# Patient Record
Sex: Female | Born: 1985 | Race: White | Hispanic: No | Marital: Married | Smoking: Never smoker
Health system: Southern US, Community
[De-identification: ages and names within clinical notes are randomized; demographics above are authoritative.]

## PROBLEM LIST (undated history)

## (undated) DIAGNOSIS — E119 Type 2 diabetes mellitus without complications: Secondary | ICD-10-CM

## (undated) DIAGNOSIS — R000 Tachycardia, unspecified: Secondary | ICD-10-CM

## (undated) HISTORY — DX: Type 2 diabetes mellitus without complications: E11.9

## (undated) HISTORY — PX: WISDOM TOOTH EXTRACTION: SHX21

---

## 2008-05-26 ENCOUNTER — Encounter: Payer: Self-pay | Admitting: Internal Medicine

## 2009-03-06 ENCOUNTER — Ambulatory Visit: Payer: Self-pay | Admitting: Internal Medicine

## 2009-03-06 DIAGNOSIS — Z9189 Other specified personal risk factors, not elsewhere classified: Secondary | ICD-10-CM | POA: Insufficient documentation

## 2009-03-06 DIAGNOSIS — E109 Type 1 diabetes mellitus without complications: Secondary | ICD-10-CM | POA: Insufficient documentation

## 2009-03-06 LAB — CONVERTED CEMR LAB: Pap Smear: NORMAL

## 2009-03-10 LAB — CONVERTED CEMR LAB
BUN: 10 mg/dL (ref 6–23)
CO2: 21 meq/L (ref 19–32)
Calcium: 9.5 mg/dL (ref 8.4–10.5)
Chloride: 110 meq/L (ref 96–112)
Creatinine, Ser: 0.8 mg/dL (ref 0.4–1.5)
Creatinine,U: 33.1 mg/dL
GFR calc non Af Amer: 125.38 mL/min (ref >60)
Glucose, Bld: 260 mg/dL — ABNORMAL HIGH (ref 70–99)
Hgb A1c MFr Bld: 7.2 % — ABNORMAL HIGH (ref 4.6–6.5)
Microalb Creat Ratio: 6 mg/g (ref 0.0–30.0)
Microalb, Ur: 0.2 mg/dL (ref 0.0–1.9)
Potassium: 4.8 meq/L (ref 3.5–5.1)
Sodium: 146 meq/L — ABNORMAL HIGH (ref 135–145)

## 2009-03-11 ENCOUNTER — Ambulatory Visit: Payer: Self-pay | Admitting: Endocrinology

## 2009-03-11 DIAGNOSIS — R000 Tachycardia, unspecified: Secondary | ICD-10-CM

## 2009-03-12 LAB — CONVERTED CEMR LAB
Basophils Absolute: 0 10*3/uL (ref 0.0–0.1)
Basophils Relative: 0.6 % (ref 0.0–3.0)
Cholesterol: 154 mg/dL (ref 0–200)
Eosinophils Absolute: 0.1 10*3/uL (ref 0.0–0.7)
Eosinophils Relative: 1.9 % (ref 0.0–5.0)
HCT: 44.6 % (ref 36.0–46.0)
HDL: 51 mg/dL (ref >39.00)
Hemoglobin: 15 g/dL (ref 12.0–15.0)
LDL Cholesterol: 94 mg/dL (ref 0–99)
Lymphocytes Relative: 24.8 % (ref 12.0–46.0)
Lymphs Abs: 1.4 10*3/uL (ref 0.7–4.0)
MCHC: 33.7 g/dL (ref 30.0–36.0)
MCV: 88.8 fL (ref 78.0–100.0)
Monocytes Absolute: 0.4 10*3/uL (ref 0.1–1.0)
Monocytes Relative: 7.4 % (ref 3.0–12.0)
Neutro Abs: 3.6 10*3/uL (ref 1.4–7.7)
Neutrophils Relative %: 65.3 % (ref 43.0–77.0)
Platelets: 224 10*3/uL (ref 150.0–400.0)
RBC: 5.02 M/uL (ref 3.87–5.11)
RDW: 11.8 % (ref 11.5–14.6)
TSH: 1.47 microintl units/mL (ref 0.35–5.50)
Total CHOL/HDL Ratio: 3
Triglycerides: 45 mg/dL (ref 0.0–149.0)
VLDL: 9 mg/dL (ref 0.0–40.0)
WBC: 5.5 10*3/uL (ref 4.5–10.5)

## 2009-04-09 ENCOUNTER — Telehealth: Payer: Self-pay | Admitting: Endocrinology

## 2009-05-07 ENCOUNTER — Telehealth: Payer: Self-pay | Admitting: Internal Medicine

## 2009-07-23 ENCOUNTER — Telehealth (INDEPENDENT_AMBULATORY_CARE_PROVIDER_SITE_OTHER): Payer: Self-pay | Admitting: *Deleted

## 2009-09-30 ENCOUNTER — Telehealth: Payer: Self-pay | Admitting: Endocrinology

## 2009-12-28 ENCOUNTER — Encounter: Admission: RE | Admit: 2009-12-28 | Discharge: 2009-12-28 | Payer: Self-pay | Admitting: Obstetrics and Gynecology

## 2010-01-27 ENCOUNTER — Telehealth: Payer: Self-pay | Admitting: Endocrinology

## 2010-02-05 ENCOUNTER — Ambulatory Visit: Payer: Self-pay | Admitting: Endocrinology

## 2010-03-09 ENCOUNTER — Telehealth: Payer: Self-pay | Admitting: Internal Medicine

## 2010-04-28 ENCOUNTER — Encounter: Payer: Self-pay | Admitting: Internal Medicine

## 2010-05-12 ENCOUNTER — Telehealth: Payer: Self-pay | Admitting: Endocrinology

## 2010-06-19 ENCOUNTER — Other Ambulatory Visit (HOSPITAL_COMMUNITY): Payer: Self-pay | Admitting: Obstetrics and Gynecology

## 2010-06-19 DIAGNOSIS — N6320 Unspecified lump in the left breast, unspecified quadrant: Secondary | ICD-10-CM

## 2010-06-19 DIAGNOSIS — N6310 Unspecified lump in the right breast, unspecified quadrant: Secondary | ICD-10-CM

## 2010-06-29 NOTE — Progress Notes (Signed)
Summary: OV due  Phone Note Outgoing Call Call back at [facility] Phone [phone]   Call placed by: Brenton Grills MA,  January 27, 2010 4:44 PM Details for Reason: OV due Summary of Call: Per MD, pt is due for OV. Left message on VM to callback office  Follow-up for Phone Call        PT CALLED.  GAVE HER AN APPT ON SEPT 9TH. Follow-up by: Hilarie Fredrickson,  January 28, 2010 10:12 AM

## 2010-06-29 NOTE — Assessment & Plan Note (Signed)
Summary: FU PER PHONE NOTE/NWS  #   Vital Signs:  Patient profile:   25 year old female Menstrual status:  regular Height:      67 inches (170.18 cm) Weight:      150.75 pounds (68.52 kg) BMI:     23.70 O2 Sat:      98 % on Room air Temp:     98.2 degrees F (36.78 degrees C) oral Pulse rate:   127 / minute BP sitting:   120 / 74  (left arm) Cuff size:   regular  Vitals Entered By: Brenton Grills MA (February 05, 2010 1:09 PM)  O2 Flow:  Room air CC: Follow-up visit/Lantus is listed twice, pt is using Lantus Solostar/aj Is Patient Diabetic? Yes   Primary Pamela Martin:  Newt Lukes MD  CC:  Follow-up visit/Lantus is listed twice and pt is using Lantus Solostar/aj.  History of Present Illness: the status of at least 3 ongoing medical problems is addressed today: dm:  no cbg record, but states cbg's are extremely variable, with no pattern throughout the day.  she says she takes oral contraceptives, and is not considering a pregnancy anytime soon.   anxiety:  she attributes probs managing dm to work-related anxiety. tachycardia:  is again noted today.  she says this is related to going to dr's office, and has been recurrent over many years.    Current Medications (verified): 1)  Lantus 100 Unit/ml Soln (Insulin Glargine) .... Use 24 Units At Bedtime 2)  Loestrin 24 Fe 1-20 Mg-Mcg Tabs (Norethin Ace-Eth Estrad-Fe) .... Take 1 By Mouth Qd 3)  Lantus Solostar 100 Unit/ml Soln (Insulin Glargine) .... 24 Units Qd 4)  Novolog Flexpen 100 Unit/ml Soln (Insulin Aspart) .... As Directed, Total of 45 Units Per Day 5)  Bd Pen Needle Short U/f 31g X 8 Mm Misc (Insulin Pen Needle) .... Any Brand Is Ok. Use Qid 6)  Onetouch Ultra Test  Strp (Glucose Blood) .... 5x A Day 150.01  Allergies (verified): No Known Drug Allergies  Past History:  Past Medical History: Last updated: 03/06/2009 Diabetes mellitus, type I  Review of Systems       she has intermittent hypoglycemia  Physical  Exam  General:  normal appearance.   Neck:  Supple without thyroid enlargement or tenderness. .  Lungs:  Clear to auscultation bilaterally. Normal respiratory effort.  Heart:  tachycardic rate and reg rhythm, without murmurs or gallops noted. Normal S1,S2.   Pulses:  dorsalis pedis intact bilat.  Extremities:  no deformity.  no ulcer on the feet.  feet are of normal color and temp.  no edema  Neurologic:  sensation is intact to touch on the feet  Skin:  normal texture and temp.  no rash.  not diaphoretic  Cervical Nodes:  No significant adenopathy.  Psych:  Alert and cooperative; normal mood and affect; normal attention span and concentration.     Impression & Recommendations:  Problem # 1:  DIABETES MELLITUS, TYPE I (ICD-250.01) pt says she wants to do labs at next ov  Problem # 2:  anxiety pt declines med for this  Problem # 3:  TACHYCARDIA (ICD-785.0) Assessment: Unchanged uncertain etiology  Medications Added to Medication List This Visit: 1)  Novolog Flexpen 100 Unit/ml Soln (Insulin aspart) .... Three times a day (just before each meal) 05-07-11 units 2)  Onetouch Ultra Test Strp (Glucose blood) .... 5x a day 250.01, and lancets 5x a day  Other Orders: Est. Patient Level III (  528[address])  Patient Instructions: 1)  check your blood sugar 5 times a day--before the 3 meals, and at bedtime.  also check if you have symptoms of your blood sugar being too high or too low.  please keep a record of the readings and bring it to your next appointment here.  please call us sooner if you are having frequent low blood sugar episodes. 2)  Please schedule a follow-up appointment in 3 months. 3)  tests are being ordered for you today.  a few days after the test(s), please call [phone] to hear your test results. 4)  please see a psychologist.  the appointment person will write a phone number for you on this paper.   5)  same lantus 6)  take novolog three times a day (just before each meal)  05-07-11 units. 7)  cc dr Renaldo Fiddler. 8)  Please schedule a follow-up appointment in 1 month. Prescriptions: ONETOUCH ULTRA TEST  STRP (GLUCOSE BLOOD) 5x a day 250.01, and lancets 5x a day  #150 x 11   Entered and Authorized by:   Minus Breeding MD   Signed by:   Minus Breeding MD on 02/05/2010   Method used:   Electronically to        [facility]. [phone]* (retail)       [phone]       [place], [place]  [address]       Ph: [phone]       Fax: [phone]   RxID:   [id number]

## 2010-06-29 NOTE — Progress Notes (Signed)
Summary: Loestrin  Phone Note Refill Request Message from:  Fax from Pharmacy on Sep 30, 2009 11:58 AM  Refills Requested: Medication #1:  LOESTRIN 24 FE 1-20 MG-MCG TABS take 1 by mouth qd Initial call taken by: Orlan Leavens,  Sep 30, 2009 11:58 AM  Follow-up for Phone Call        MD only see md for diabetes. Need to contact patient GYN or PCP to get approval. Fax info back to pharm Follow-up by: Orlan Leavens,  Sep 30, 2009 11:59 AM

## 2010-06-29 NOTE — Progress Notes (Signed)
Summary: Youth worker Note Call from Patient   Caller: Patient Call For: ellison Summary of Call: pt request increase in amount of onetouch ultra strip---state she get 100 per month now but states she needs more --request over 150 per month---[facility]  on [address]   Please advise.  Initial call taken by: Tora Perches,  July 23, 2009 3:59 PM  Follow-up for Phone Call        sent Follow-up by: Minus Breeding MD,  July 23, 2009 4:13 PM  Additional Follow-up for Phone Call Additional follow up Details #1::        Informed pt. Additional Follow-up by: Josph Macho RMA,  July 23, 2009 4:23 PM    New/Updated Medications: ONETOUCH ULTRA TEST  STRP (GLUCOSE BLOOD) 5x a day 150.01 Prescriptions: ONETOUCH ULTRA TEST  STRP (GLUCOSE BLOOD) 5x a day 150.01  #150 x 11   Entered and Authorized by:   Minus Breeding MD   Signed by:   Minus Breeding MD on 07/23/2009   Method used:   Electronically to        [facility]. [phone]* (retail)       [phone]       [place], [place]  [address]       Ph: [phone]       Fax: [phone]   RxID:   [id number]

## 2010-06-29 NOTE — Progress Notes (Signed)
Summary: Referral  Phone Note Call from Patient Call back at Home Phone [phone]   Caller: Patient Summary of Call: Pt called requesting referral to Dr Talmage Nap at [facility]. Initial call taken by: Margaret Pyle, CMA,  March 09, 2010 2:28 PM  Follow-up for Phone Call        ok - order done as per request Follow-up by: Newt Lukes MD,  March 09, 2010 3:11 PM  Additional Follow-up for Phone Call Additional follow up Details #1::        Pt informed Additional Follow-up by: Margaret Pyle, CMA,  March 09, 2010 3:43 PM

## 2010-07-01 ENCOUNTER — Other Ambulatory Visit: Payer: Self-pay

## 2010-07-01 NOTE — Progress Notes (Signed)
Summary: rx refill req  Phone Note Refill Request Message from:  Fax from Pharmacy on May 12, 2010 11:07 AM  Refills Requested: Medication #1:  NOVOLOG FLEXPEN 100 UNIT/ML SOLN three times a day (just before each meal) 05-07-11 units   Dosage confirmed as above?Dosage Confirmed   Last Refilled: 03/29/2010  Method Requested: Electronic Next Appointment Scheduled: none Initial call taken by: Brenton Grills CMA Duncan Dull),  May 12, 2010 11:07 AM    Prescriptions: NOVOLOG FLEXPEN 100 UNIT/ML SOLN (INSULIN ASPART) three times a day (just before each meal) 05-07-11 units  #1 month x 3   Entered by:   Brenton Grills CMA (AAMA)   Authorized by:   Minus Breeding MD   Signed by:   Brenton Grills CMA (AAMA) on 05/12/2010   Method used:   Electronically to        [facility]. [phone]* (retail)       [phone] [address]       [place], [place]  [address]       Ph: [phone]       Fax: [phone]   RxID:   [id number]

## 2010-07-01 NOTE — Consult Note (Signed)
Summary: [facility]  [facility]   Imported By: Lester Norwalk 05/26/2010 11:20:28  _____________________________________________________________________  External Attachment:    Type:   Image     Comment:   External Document

## 2010-07-05 ENCOUNTER — Ambulatory Visit
Admission: RE | Admit: 2010-07-05 | Discharge: 2010-07-05 | Disposition: A | Discharge status: Home / Self Care | Payer: BC Managed Care – PPO | Source: Ambulatory Visit | Attending: Obstetrics and Gynecology | Admitting: Obstetrics and Gynecology

## 2010-07-05 DIAGNOSIS — N6310 Unspecified lump in the right breast, unspecified quadrant: Secondary | ICD-10-CM

## 2014-08-19 ENCOUNTER — Other Ambulatory Visit: Payer: BC Managed Care – PPO

## 2014-08-19 ENCOUNTER — Encounter: Payer: Self-pay | Admitting: Internal Medicine

## 2014-08-19 ENCOUNTER — Ambulatory Visit (INDEPENDENT_AMBULATORY_CARE_PROVIDER_SITE_OTHER): Payer: BC Managed Care – PPO | Admitting: Internal Medicine

## 2014-08-19 VITALS — BP 120/90 | HR 111 | Temp 98.3°F | Resp 16 | Ht 67.0 in | Wt 170.0 lb

## 2014-08-19 DIAGNOSIS — R000 Tachycardia, unspecified: Secondary | ICD-10-CM

## 2014-08-19 MED ORDER — METOPROLOL TARTRATE 25 MG PO TABS
ORAL_TABLET | ORAL | Status: DC
Start: 2014-08-19 — End: 2014-10-13

## 2014-08-19 NOTE — Progress Notes (Signed)
   Subjective:    Patient ID: Pamela Martin, female    DOB: 26-Aug-1985, 29 y.o.   MRN: [id number]  HPI Symptoms began 08/18/14 as "my heart was racing". [facility]. Extensive testing included chemistries, cardiac enzymes, d-dimer, chest x-ray, and TSH. The results of those labs are not available. Reviewing the chart here indicates that tachycardia was mentioned under problem list in 2010.  Heart rate has ranged from the 70s to 120s with a occasional rate as high as 150. She has no associated nausea, vomiting, diaphoresis, or chest pain. She does have some shortness of breath during the episodes as well as some fatigue. She notices it will gradually slow down.  [facility] diabetes. Blood sugars range 100-140s. She has had some hypoglycemic episodes but none in the last 3 days.   She has decreased her coffee intake since these episodes began. Typical she would have 1 or 2 cups of coffee a day.  Father & PGF had MIs ,not premature.No FH thyroid disease.   No labs on record since 2010.  Review of Systems Pertinent negative or absent signs and symptoms are as follows: Constitutional: No significant change in weight; significant fatigue; sleep disorder; change in appetite. Eye: no blurred, double ,loss of vision ENT/GI: no constipation; diarrhea;hoarseness;dysphagia Derm: no change in nails,hair,skin Neuro: no numbness or tingling; tremor Psych:no anxiety; depression; panic attacks Endo: no temperature intolerance to heat ,cold     Objective:   Physical Exam  Gen.:  Adequately nourished; in no acute distress Eyes: Extraocular motion intact; no lid lag , proptosis , or nystagmus Neck: full ROM; no masses ; thyroid normal  Heart: Normal rhythm ;accelearted rate without significant murmur, gallop, or extra heart sounds Lungs: Chest clear to auscultation without rales,rales, wheezes Neuro:Deep tendon reflexes are equal and within  normal limits; no tremor  Skin/Nails: Warm and dry without significant lesions or rashes; no onycholysis Lymphatic: no cervical or axillary LA Psych: Normally communicative and interactive; no abnormal mood or affect clinically.         Assessment & Plan:  #1 tachycardia; EKG reveals sinus tachycardia with a rate of 111.[facility] labs pending See orders

## 2014-08-19 NOTE — Patient Instructions (Addendum)
To prevent tachycardia, avoid stimulants such as decongestants, diet pills, nicotine, or caffeine (coffee, tea, cola, or chocolate) to excess. 24 hour urine studies to rule out increased production of stress hormones (metanephrines and catecholamines).

## 2014-08-19 NOTE — Progress Notes (Signed)
Pre visit review using our clinic review tool, if applicable. No additional management support is needed unless otherwise documented below in the visit note. 

## 2014-08-21 ENCOUNTER — Other Ambulatory Visit: Payer: BC Managed Care – PPO

## 2014-08-21 DIAGNOSIS — R000 Tachycardia, unspecified: Secondary | ICD-10-CM

## 2014-08-25 LAB — CATECHOLAMINES, FRACTIONATED, URINE, 24 HOUR
CALCULATED TOTAL (E+ NE): 36 ug/(24.h) (ref 26–121)
Creatinine, Urine mg/day-CATEUR: 1.23 g/(24.h) (ref 0.63–2.50)
Dopamine, 24 hr Urine: 269 mcg/24 h (ref 52–480)
EPINEPHRINE, 24 HR URINE: 8 ug/(24.h) (ref 2–24)
NOREPINEPHRINE, 24 HR UR: 28 ug/(24.h) (ref 15–100)
TOTAL VOLUME - CF 24HR U: 1100 mL

## 2014-08-25 LAB — METANEPHRINES, URINE, 24 HOUR
METANEPH TOTAL UR: 212 ug/(24.h) (ref 115–695)
Metanephrines, Ur: 83 mcg/24 h (ref 36–190)
Normetanephrine, 24H Ur: 129 mcg/24 h (ref 35–482)

## 2014-08-26 ENCOUNTER — Other Ambulatory Visit: Payer: Self-pay | Admitting: Internal Medicine

## 2014-08-26 DIAGNOSIS — R000 Tachycardia, unspecified: Secondary | ICD-10-CM

## 2014-09-01 ENCOUNTER — Emergency Department (HOSPITAL_COMMUNITY): Payer: BC Managed Care – PPO

## 2014-09-01 ENCOUNTER — Encounter (HOSPITAL_COMMUNITY): Payer: Self-pay | Admitting: Emergency Medicine

## 2014-09-01 ENCOUNTER — Emergency Department (HOSPITAL_COMMUNITY)
Admission: EM | Admit: 2014-09-01 | Discharge: 2014-09-02 | Disposition: A | Discharge status: Home / Self Care | Payer: BC Managed Care – PPO | Attending: Emergency Medicine | Admitting: Emergency Medicine

## 2014-09-01 DIAGNOSIS — Z794 Long term (current) use of insulin: Secondary | ICD-10-CM | POA: Insufficient documentation

## 2014-09-01 DIAGNOSIS — R000 Tachycardia, unspecified: Secondary | ICD-10-CM

## 2014-09-01 DIAGNOSIS — E119 Type 2 diabetes mellitus without complications: Secondary | ICD-10-CM | POA: Insufficient documentation

## 2014-09-01 DIAGNOSIS — R0602 Shortness of breath: Secondary | ICD-10-CM | POA: Diagnosis present

## 2014-09-01 DIAGNOSIS — R079 Chest pain, unspecified: Secondary | ICD-10-CM | POA: Insufficient documentation

## 2014-09-01 HISTORY — DX: Tachycardia, unspecified: R00.0

## 2014-09-01 LAB — CBC WITH DIFFERENTIAL/PLATELET
BASOS ABS: 0 10*3/uL (ref 0.0–0.1)
BASOS PCT: 0 % (ref 0–1)
EOS ABS: 0.1 10*3/uL (ref 0.0–0.7)
Eosinophils Relative: 1 % (ref 0–5)
HCT: 46.4 % — ABNORMAL HIGH (ref 36.0–46.0)
Hemoglobin: 16 g/dL — ABNORMAL HIGH (ref 12.0–15.0)
Lymphocytes Relative: 19 % (ref 12–46)
Lymphs Abs: 2.1 10*3/uL (ref 0.7–4.0)
MCH: 29.4 pg (ref 26.0–34.0)
MCHC: 34.5 g/dL (ref 30.0–36.0)
MCV: 85.3 fL (ref 78.0–100.0)
MONOS PCT: 8 % (ref 3–12)
Monocytes Absolute: 0.9 10*3/uL (ref 0.1–1.0)
NEUTROS PCT: 72 % (ref 43–77)
Neutro Abs: 7.9 10*3/uL — ABNORMAL HIGH (ref 1.7–7.7)
PLATELETS: 264 10*3/uL (ref 150–400)
RBC: 5.44 MIL/uL — ABNORMAL HIGH (ref 3.87–5.11)
RDW: 12.4 % (ref 11.5–15.5)
WBC: 11 10*3/uL — ABNORMAL HIGH (ref 4.0–10.5)

## 2014-09-01 LAB — BASIC METABOLIC PANEL
Anion gap: 13 (ref 5–15)
BUN: 10 mg/dL (ref 6–23)
CHLORIDE: 103 mmol/L (ref 96–112)
CO2: 20 mmol/L (ref 19–32)
CREATININE: 0.74 mg/dL (ref 0.50–1.10)
Calcium: 9.6 mg/dL (ref 8.4–10.5)
GFR calc Af Amer: >90 mL/min (ref >90)
GFR calc non Af Amer: >90 mL/min (ref >90)
GLUCOSE: 209 mg/dL — AB (ref 70–99)
Potassium: 3.9 mmol/L (ref 3.5–5.1)
Sodium: 136 mmol/L (ref 135–145)

## 2014-09-01 LAB — I-STAT BETA HCG BLOOD, ED (MC, WL, AP ONLY): I-stat hCG, quantitative: <5 m[IU]/mL (ref <5)

## 2014-09-01 LAB — D-DIMER, QUANTITATIVE: D-Dimer, Quant: <0.27 ug/mL-FEU (ref 0.00–0.48)

## 2014-09-01 LAB — TSH: TSH: 1.831 u[IU]/mL (ref 0.350–4.500)

## 2014-09-01 MED ORDER — SODIUM CHLORIDE 0.9 % IV BOLUS (SEPSIS)
1000.0000 mL | Freq: Once | INTRAVENOUS | Status: AC
  Administered 2014-09-01: 1000 mL via INTRAVENOUS

## 2014-09-01 MED ORDER — METOPROLOL TARTRATE 25 MG PO TABS
25.0000 mg | ORAL_TABLET | Freq: Once | ORAL | Status: AC
  Administered 2014-09-02: 25 mg via ORAL
  Filled 2014-09-01: qty 1

## 2014-09-01 NOTE — ED Notes (Signed)
Patient reports chest pain (epigastric region radiating to central chest) and trouble breathing starting 30 minutes ago. Recently saw PCP and diagnosed with "tachycardia." Given Metoprolol 25 mg to take PRN. Has not taken any medication today. Not able to note alleviating or precipitating factors. Hx DM Type 1. Denies N/V/D/F. Has Cardiologist appointment in May. No other c/c.

## 2014-09-01 NOTE — Discharge Instructions (Signed)
Start taking her metoprolol every 12 hours, if he still fill your heart racing or have chest pain you can increase this to 2 pills every 12 hours in 5 days.  Please follow with your primary care doctor in the next 2 days for a check-up. They must obtain records for further management.   Do not hesitate to return to the Emergency Department for any new, worsening or concerning symptoms.

## 2014-09-01 NOTE — ED Provider Notes (Signed)
CSN: [id number]     Arrival date & time 09/01/14  1920 History   First MD Initiated Contact with Patient 09/01/14 2047     No chief complaint on file.    (Consider location/radiation/quality/duration/timing/severity/associated sxs/prior Treatment) HPI   Pamela Martin is a 29 y.o. female with past medical history significant for insulin-dependent diabetes complaining of palpitations, substernal chest pain she describes it like somebody punched her onset this afternoon lasting about 10-15 minutes and resolving spontaneously associated with shortness of breath and epigastric pain. Patient was seen at [facility] for palpitations 2 weeks ago, she states that her thyroid tests were normal then, she followed with her primary care physician who started her on 25 mg of metoprolol when necessary, she's had this 3 times, she did not take any today. She states that the chest pain and shortness of breath are new in her symptoms, she has an appointment with a cardiologist in mid-May she has not seen them yet. She reports a 10 pound unintentional weight loss over the last 2 weeks. She states her blood sugars have been running normal, between 73 and 220. Patient denies nausea, vomiting, diarrhea, melena, heavy menstruation, fever, cough. She takes hormonal birth control but she self DC'd this 3 weeks ago. Patient denies immobilizations, calf pain, leg swelling, history of DVT or PE, family history of blood dyscrasia, family history of early cardiac death.  Past Medical History  Diagnosis Date  . Diabetes mellitus without complication   . Tachycardia    History reviewed. No pertinent past surgical history. Family History  Problem Relation Age of Onset  . Heart disease Father    History  Substance Use Topics  . Smoking status: Never Smoker   . Smokeless tobacco: Not on file  . Alcohol Use: 1.2 oz/week    2 Standard drinks or equivalent per week   OB History    No data available     Review of Systems  10  systems reviewed and found to be negative, except as noted in the HPI.   Allergies  Review of patient's allergies indicates no known allergies.  Home Medications   Prior to Admission medications   Medication Sig Start Date End Date Taking? Authorizing Provider  insulin aspart (NOVOLOG) 100 UNIT/ML injection Inject 8-12 Units into the skin 3 (three) times daily before meals.    Yes Historical Provider, MD  insulin glargine (LANTUS) 100 UNIT/ML injection Inject 22 Units into the skin at bedtime.   Yes Historical Provider, MD  metoprolol tartrate (LOPRESSOR) 25 MG tablet q 12 hrs prn tachycardia 08/19/14  Yes Pecola LawlessWilliam F Hopper, MD   BP 136/82 mmHg  Pulse 109  Temp(Src) 98.2 F (36.8 C) (Oral)  Resp 15  Ht 5\' 6"  (1.676 m)  Wt 160 lb (72.576 kg)  BMI 25.84 kg/m2  SpO2 99%  LMP 08/15/2014 (Approximate) Physical Exam  Constitutional: She is oriented to person, place, and time. She appears well-developed and well-nourished. No distress.  HENT:  Head: Normocephalic and atraumatic.  Mouth/Throat: Oropharynx is clear and moist.  No conjunctival pallor  Eyes: Conjunctivae and EOM are normal. Pupils are equal, round, and reactive to light.  Neck: Neck supple.  Cardiovascular: Normal rate.   Mild tachycardia  Pulmonary/Chest: Effort normal and breath sounds normal. No stridor. No respiratory distress. She has no wheezes. She has no rales. She exhibits no tenderness.  Abdominal: Soft. Bowel sounds are normal. She exhibits no distension and no mass. There is no tenderness. There is no rebound.  Musculoskeletal: Normal range of motion. She exhibits no edema or tenderness.  No calf asymmetry, superficial collaterals, palpable cords, edema, Homans sign negative bilaterally.    Neurological: She is alert and oriented to person, place, and time.  Psychiatric: She has a normal mood and affect.  Nursing note and vitals reviewed.   ED Course  Procedures (including critical care time) Labs  Review Labs Reviewed  CBC WITH DIFFERENTIAL/PLATELET - Abnormal; Notable for the following:    WBC 11.0 (*)    RBC 5.44 (*)    Hemoglobin 16.0 (*)    HCT 46.4 (*)    Neutro Abs 7.9 (*)    All other components within normal limits  BASIC METABOLIC PANEL - Abnormal; Notable for the following:    Glucose, Bld 209 (*)    All other components within normal limits  D-DIMER, QUANTITATIVE  TSH  I-STAT BETA HCG BLOOD, ED ([facility], [facility], [facility] ONLY)    Imaging Review Dg Chest 2 View  09/01/2014   CLINICAL DATA:  Shortness of breath for 72 hours. Rapid heart rate tonight. Weakness.  EXAM: CHEST  2 VIEW  COMPARISON:  None.  FINDINGS: The heart size and mediastinal contours are within normal limits. Both lungs are clear. The visualized skeletal structures are unremarkable.  IMPRESSION: No active cardiopulmonary disease.   Electronically Signed   By: Norva Pavlov M.D.   On: 09/01/2014 22:05     EKG Interpretation   Date/Time:  Monday September 01 2014 19:38:03 EDT Ventricular Rate:  120 PR Interval:  118 QRS Duration: 77 QT Interval:  307 QTC Calculation: 434 R Axis:   88 Text Interpretation:  Sinus tachycardia Baseline wander in lead(s) V6  Confirmed by Fayrene Fearing  MD, MARK ([id number]) on 09/01/2014 7:47:24 PM      MDM   Final diagnoses:  SOB (shortness of breath)  Tachycardia  Chest pain, unspecified chest pain type    Filed Vitals:   09/01/14 2145 09/01/14 2156 09/01/14 2241 09/01/14 2330  BP: 132/78 132/78 136/82   Pulse: 108 115 113 109  Temp:      TempSrc:      Resp: Height:      Weight:      SpO2: 98% 99% 98% 99%    Medications  sodium chloride 0.9 % bolus 1,000 mL (1,000 mLs Intravenous New Bag/Given 09/01/14 2153)  metoprolol tartrate (LOPRESSOR) tablet 25 mg (25 mg Oral Given 09/02/14 0002)    Pamela Martin is a pleasant 29 y.o. female presenting with palpitations, chest pain, shortness of breath. Physical exam is not consistent with DVT. D-dimer is ordered. Patient has  been diagnosed with a sinus tachycardia, she was given metoprolol 25 mg to take when necessary, she has not had this recently, she has not seen a cardiologist. She has a 10 pound unintentional weight loss over the last 2 weeks, TSH is ordered. Patient will be bolused 1 L, basic blood work, EKG, chest x-ray.  Patient with no anemia, appears slightly hemoconcentrated. TSH and d-dimer are normal. Chest x-ray and EKG with no significant abnormality.  This was discussed with attending, recommends changing metoprolol to twice a day instead of when necessary. Also she can up her dosage to 50 mg twice a day if needed. She will follow with primary care and her cardiologist.  Evaluation does not show pathology that would require ongoing emergent intervention or inpatient treatment. Pt is hemodynamically stable and mentating appropriately. Discussed findings and plan with patient/guardian, who agrees with  care plan. All questions answered. Return precautions discussed and outpatient follow up given.      Wynetta Emery, PA-C 09/02/14 1610  Rolland Porter, MD 09/06/14 [phone]

## 2014-09-05 ENCOUNTER — Encounter: Payer: Self-pay | Admitting: Internal Medicine

## 2014-09-05 ENCOUNTER — Ambulatory Visit (INDEPENDENT_AMBULATORY_CARE_PROVIDER_SITE_OTHER): Payer: BC Managed Care – PPO | Admitting: Internal Medicine

## 2014-09-05 VITALS — BP 118/76 | HR 119 | Temp 98.0°F | Resp 14 | Ht 66.0 in | Wt 166.0 lb

## 2014-09-05 DIAGNOSIS — R000 Tachycardia, unspecified: Secondary | ICD-10-CM

## 2014-09-05 NOTE — Patient Instructions (Signed)
The Holter monitor referral will be scheduled and you'll be notified of the time.Please call the Referral Co-Ordinator @ [phone] if you have not been notified of appointment time within 5 days. To help prevent rapid heart beats, avoid stimulants such as decongestants, diet pills, nicotine, or caffeine (coffee, tea, cola, or chocolate) to excess.

## 2014-09-05 NOTE — Progress Notes (Signed)
   Subjective:    Patient ID: Pamela Martin, female    DOB: December 06, 1985, 29 y.o.   MRN: [id number]  HPI   She returns for evaluation of the tachycardia; she was seen in the emergency room 09/01/14. Those records were reviewed. She presented with some chest discomfort as if " punched in chest". She also had associated shortness of breath. With that episode she had some sharp upper epigastric pain. This last symptom had not been present before. She was found to have a sinus tachycardia with rate of 120. TSH was therapeutic. D-dimer was less than 0.27. CBC revealed no anemia.   She was placed on metoprolol 25 mg twice a day. As of starting this medication her symptoms have improved with decreased frequency and severity of the tachycardia. She still has some symptoms mainly in the morning.  The chest tightness has also resolved with the beta blocker. On the beta blocker she's had some fatigue. She's had unexplained weight loss of approximate 4-5 pounds.   Her symptom complex began after she stopped her birth control pills. She was having no symptoms while on this.  She is an insulin-dependent diabetic; she denies any relationship of symptoms to hypoglycemia.   She did have 24-hour urine collection for catecholamines and metanephrines which were negative or normal 3/24.  Review of Systems  At this time she denies significant cardiopulmonary or GI symptoms. She states she's at least "40%" better compared to 4/4 with the beta blocker.  Chest pain, palpitations, tachycardia, exertional dyspnea, paroxysmal nocturnal dyspnea, claudication or edema are absent.  Since 4/4 abdominal pain, significant dyspepsia, dysphagia, melena, rectal bleeding, or persistently small caliber stools are denied.     Objective:   Physical Exam  Pertinent or positive findings include:  The tachycardia persists ;rate 100 on recheck. Aorta is palpable; there is no aneurysm.  Gen.:  Adequately nourished; in no acute  distress Eyes: Extraocular motion intact; no lid lag , proptosis , or nystagmus Neck: full ROM; no masses ; thyroid normal  Heart: Normal rhythm  without significant murmur, gallop, or extra heart sounds Lungs: Chest clear to auscultation without rales,rales, wheezes Neuro:Deep tendon reflexes are equal and within normal limits; no tremor  Skin/Nails: Warm and dry without significant lesions or rashes; onycholysis can not be assessed due to nail polish Lymphatic: no cervical or axillary LA Psych: Normally communicative and interactive; no abnormal mood or affect clinically.          Assessment & Plan:   #1 persistent tachycardia with normal thyroid function studies in urine for metanephrines and catecholamines.  #2 chest and abdominal pain 4/4 ; resolved with beta blocker.  Plan: Cardiology consult will be pursued. Holter monitor also to be scheduled as symptoms persist even on Beta blocker.

## 2014-09-05 NOTE — Progress Notes (Signed)
Pre visit review using our clinic review tool, if applicable. No additional management support is needed unless otherwise documented below in the visit note. 

## 2014-09-15 ENCOUNTER — Telehealth: Payer: Self-pay | Admitting: Internal Medicine

## 2014-09-15 NOTE — Telephone Encounter (Signed)
Patient is calling about her referral for her for a holter monitor. Dr. Alwyn RenHopper told her to call back in 5 days if she hasn't heard anything

## 2014-09-16 NOTE — Telephone Encounter (Signed)
Sent to cardiology. They will call her regarding holter monitor. Called to make pt aware and someone answered then hung up on me.

## 2014-10-07 ENCOUNTER — Ambulatory Visit (INDEPENDENT_AMBULATORY_CARE_PROVIDER_SITE_OTHER): Payer: BC Managed Care – PPO

## 2014-10-07 ENCOUNTER — Encounter: Payer: Self-pay | Admitting: *Deleted

## 2014-10-07 ENCOUNTER — Other Ambulatory Visit: Payer: Self-pay | Admitting: Internal Medicine

## 2014-10-07 DIAGNOSIS — R000 Tachycardia, unspecified: Secondary | ICD-10-CM | POA: Diagnosis not present

## 2014-10-07 NOTE — Progress Notes (Unsigned)
Patient ID: Pamela Martin, female   DOB: 04-Dec-1985, 29 y.o.   MRN: [id number] [employer] 24 hour holter monitor applie to patient.

## 2014-10-13 ENCOUNTER — Encounter: Payer: Self-pay | Admitting: Cardiology

## 2014-10-13 ENCOUNTER — Ambulatory Visit (INDEPENDENT_AMBULATORY_CARE_PROVIDER_SITE_OTHER): Payer: BC Managed Care – PPO | Admitting: Cardiology

## 2014-10-13 VITALS — BP 124/70 | HR 109 | Ht 65.0 in | Wt 168.0 lb

## 2014-10-13 DIAGNOSIS — R002 Palpitations: Secondary | ICD-10-CM | POA: Diagnosis not present

## 2014-10-13 DIAGNOSIS — E109 Type 1 diabetes mellitus without complications: Secondary | ICD-10-CM | POA: Insufficient documentation

## 2014-10-13 DIAGNOSIS — R000 Tachycardia, unspecified: Secondary | ICD-10-CM

## 2014-10-13 NOTE — Patient Instructions (Addendum)
Medication Instructions:  You may taper off your Metoprolol by taking 1/2 tablet twice a day for 2 days then stop.  Keep them with you incase you have palpitations and need to take one. Continue all other medications as listed.  Testing/Procedures: Your physician has requested that you have an echocardiogram. Echocardiography is a painless test that uses sound waves to create images of your heart. It provides your doctor with information about the size and shape of your heart and how well your heart's chambers and valves are working. This procedure takes approximately one hour. There are no restrictions for this procedure.  Follow-Up: As needed  Thank you for choosing [facility]!!

## 2014-10-13 NOTE — Progress Notes (Signed)
Cardiology Office Note   Date:  10/13/2014   ID:  Pamela Martin, DOB 1986-02-29, MRN [id number]  PCP:  Rene PaciValerie Leschber, MD  Cardiologist:   Donato SchultzSKAINS, MARK, MD       History of Present Illness: Pamela Martin is a 29 y.o. female who presents for her for the evaluation of tachycardia. Previously in the emergency room 09/01/14. Felt as though someone had punched her in the chest. She was having shortness of breath. Sharp upper gastric pain. EKG at that time was 120 sinus tachycardia. D-dimer was normal. CBC was normal. Metoprolol was given to her 25 mg twice a day. Chest tightness also resolved.  She has risk factors of diabetes. She had an evaluation for catecholamines and metanephrines which were normal on 08/21/14.    Past Medical History  Diagnosis Date  . Diabetes mellitus without complication   . Tachycardia     History reviewed. No pertinent past surgical history.   Current Outpatient Prescriptions  Medication Sig Dispense Refill  . insulin aspart (NOVOLOG) 100 UNIT/ML injection Inject 8-12 Units into the skin 3 (three) times daily before meals.     . insulin glargine (LANTUS) 100 UNIT/ML injection Inject 22 Units into the skin at bedtime.    . metoprolol tartrate (LOPRESSOR) 25 MG tablet Take 25 mg by mouth 2 (two) times daily.     No current facility-administered medications for this visit.    Allergies:   Review of patient's allergies indicates no known allergies.    Social History:  The patient  reports that she has never smoked. She has never used smokeless tobacco. She reports that she drinks about 1.8 oz of alcohol per week.   Family History:  The patient's family history includes Alcohol abuse in her brother; Heart disease in her father. Father had MI 1650's.    ROS:  Please see the history of present illness.   Otherwise, review of systems are positive for none.   All other systems are reviewed and negative.    PHYSICAL EXAM: VS:  BP 124/70 mmHg  Pulse 109   Ht 5\' 5"  (1.651 m)  Wt 168 lb (76.204 kg)  BMI 27.96 kg/m2 , BMI Body mass index is 27.96 kg/(m^2). GEN: Well nourished, well developed, in no acute distress HEENT: normal Neck: no JVD, carotid bruits, or masses Cardiac: RRR; no murmurs, rubs, or gallops,no edema, mildly tachy.  Respiratory:  clear to auscultation bilaterally, normal work of breathing GI: soft, nontender, nondistended, + BS MS: no deformity or atrophy Skin: warm and dry, no rash Neuro:  Strength and sensation are intact Psych: euthymic mood, full affect   EKG:  EKG is not ordered today. The prior ekg ordered today demonstrates sinus tachycardia    Recent Labs: 09/01/2014: BUN 10; Creatinine 0.74; Hemoglobin 16.0*; Platelets 264; Potassium 3.9; Sodium 136; TSH 1.831    Lipid Panel    Component Value Date/Time   CHOL 154 03/11/2009 0840   TRIG 45.0 03/11/2009 0840   HDL 51.00 03/11/2009 0840   CHOLHDL 3 03/11/2009 0840   VLDL 9.0 03/11/2009 0840   LDLCALC 94 03/11/2009 0840      Wt Readings from Last 3 Encounters:  10/13/14 168 lb (76.204 kg)  09/05/14 166 lb (75.297 kg)  09/01/14 160 lb (72.576 kg)      Other studies Reviewed: Additional studies/ records that were reviewed today include: ER records, lab work, EKGs. Review of the above records demonstrates: As above   ASSESSMENT AND PLAN:  1.  Sinus tachycardia/palpitations-inappropriate sinus tachycardia. We will check an echocardiogram to ensure proper structure and function of her heart. Extensive other workup including Holter monitor was reassuring showing no adverse arrhythmias. Her heart rate variability was normal. Her heart rate went down to 51 bpm just before awakening. Maximum heart rate was 150. Average heart rate was 89 bpm. No PVCs, no PACs. Other workup including catecholamines were normal. D-dimer normal. TSH normal. Does not seem to be associated with glucose change. Could be autonomic tone variation. Encouraged continued exercise.  Avoidance of caffeine, stimulants. She does not take any supplements. I'm fine with her tapering off of her metoprolol and using it on an as-needed basis. She feels as though the beta blocker blunts her hypoglycemic reaction. She will let me know if any further worrisome symptoms develop.  2. Diabetes-diagnosed at 1516 months old. Quite vigilant about her diabetes. Last hemoglobin A1c was in the low 6 range.  Current medicines are reviewed at length with the patient today.  The patient does not have concerns regarding medicines.  The following changes have been made:  Taper off metoprolol and use PRN  Labs/ tests ordered today include:   Orders Placed This Encounter  Procedures  . Echocardiogram     Disposition:  When necessary follow-up  Signed, Donato SchultzSKAINS, MARK, MD  10/13/2014 3:16 PM    [facility] [address], [place], [place]  [address] Phone: [phone]; Fax: [phone]

## 2014-10-16 ENCOUNTER — Ambulatory Visit (HOSPITAL_COMMUNITY): Payer: BC Managed Care – PPO | Attending: Cardiology

## 2014-10-16 ENCOUNTER — Other Ambulatory Visit: Payer: Self-pay

## 2014-10-16 DIAGNOSIS — R002 Palpitations: Secondary | ICD-10-CM | POA: Diagnosis not present

## 2014-10-16 DIAGNOSIS — R000 Tachycardia, unspecified: Secondary | ICD-10-CM | POA: Diagnosis not present

## 2014-10-20 ENCOUNTER — Telehealth: Payer: Self-pay | Admitting: Cardiology

## 2014-10-20 NOTE — Telephone Encounter (Signed)
Pt rtn call from Friday re test results--pls call [phone]

## 2014-10-20 NOTE — Telephone Encounter (Signed)
Called pt with results of echo.  Pt stated understanding.

## 2014-11-18 ENCOUNTER — Telehealth: Payer: Self-pay

## 2014-11-18 NOTE — Telephone Encounter (Signed)
Pt stated she would make it in to the walk in lab before the week was over.

## 2016-09-15 LAB — OB RESULTS CONSOLE ABO/RH: RH Type: POSITIVE

## 2016-09-15 LAB — OB RESULTS CONSOLE RUBELLA ANTIBODY, IGM: Rubella: IMMUNE

## 2016-09-15 LAB — OB RESULTS CONSOLE HIV ANTIBODY (ROUTINE TESTING): HIV: NONREACTIVE

## 2016-09-15 LAB — OB RESULTS CONSOLE GC/CHLAMYDIA
Chlamydia: NEGATIVE
Gonorrhea: NEGATIVE

## 2016-09-15 LAB — OB RESULTS CONSOLE RPR: RPR: NONREACTIVE

## 2016-09-15 LAB — OB RESULTS CONSOLE HEPATITIS B SURFACE ANTIGEN: Hepatitis B Surface Ag: NEGATIVE

## 2016-09-15 LAB — OB RESULTS CONSOLE ANTIBODY SCREEN: Antibody Screen: NEGATIVE

## 2017-04-11 ENCOUNTER — Telehealth (HOSPITAL_COMMUNITY): Payer: Self-pay | Admitting: *Deleted

## 2017-04-11 ENCOUNTER — Encounter (HOSPITAL_COMMUNITY): Payer: Self-pay | Admitting: *Deleted

## 2017-04-11 LAB — OB RESULTS CONSOLE GBS: STREP GROUP B AG: NEGATIVE

## 2017-04-11 NOTE — Telephone Encounter (Signed)
Preadmission screen  

## 2017-04-16 ENCOUNTER — Inpatient Hospital Stay (HOSPITAL_COMMUNITY)
Admission: AD | Admit: 2017-04-16 | Discharge: 2017-04-19 | DRG: 786 | Disposition: A | Discharge status: Home / Self Care | Payer: BC Managed Care – PPO | Source: Ambulatory Visit | Attending: Obstetrics and Gynecology | Admitting: Obstetrics and Gynecology

## 2017-04-16 ENCOUNTER — Other Ambulatory Visit: Payer: Self-pay

## 2017-04-16 ENCOUNTER — Encounter (HOSPITAL_COMMUNITY): Payer: Self-pay

## 2017-04-16 DIAGNOSIS — E109 Type 1 diabetes mellitus without complications: Secondary | ICD-10-CM | POA: Diagnosis present

## 2017-04-16 DIAGNOSIS — Z3A38 38 weeks gestation of pregnancy: Secondary | ICD-10-CM | POA: Diagnosis not present

## 2017-04-16 DIAGNOSIS — O2402 Pre-existing diabetes mellitus, type 1, in childbirth: Secondary | ICD-10-CM | POA: Diagnosis present

## 2017-04-16 DIAGNOSIS — O4292 Full-term premature rupture of membranes, unspecified as to length of time between rupture and onset of labor: Principal | ICD-10-CM | POA: Diagnosis present

## 2017-04-16 DIAGNOSIS — Z794 Long term (current) use of insulin: Secondary | ICD-10-CM | POA: Diagnosis not present

## 2017-04-16 DIAGNOSIS — Z8759 Personal history of other complications of pregnancy, childbirth and the puerperium: Secondary | ICD-10-CM

## 2017-04-16 LAB — GLUCOSE, CAPILLARY
Glucose-Capillary: 87 mg/dL (ref 65–99)
Glucose-Capillary: 159 mg/dL — ABNORMAL HIGH (ref 65–99)

## 2017-04-16 LAB — CBC
HEMATOCRIT: 37.8 % (ref 36.0–46.0)
HEMOGLOBIN: 12.5 g/dL (ref 12.0–15.0)
MCH: 28.8 pg (ref 26.0–34.0)
MCHC: 33.1 g/dL (ref 30.0–36.0)
MCV: 87.1 fL (ref 78.0–100.0)
Platelets: 157 10*3/uL (ref 150–400)
RBC: 4.34 MIL/uL (ref 3.87–5.11)
RDW: 13.1 % (ref 11.5–15.5)
WBC: 7.9 10*3/uL (ref 4.0–10.5)

## 2017-04-16 LAB — TYPE AND SCREEN
ABO/RH(D): O POS
ANTIBODY SCREEN: NEGATIVE

## 2017-04-16 LAB — POCT FERN TEST

## 2017-04-16 LAB — GROUP B STREP BY PCR: Group B strep by PCR: NEGATIVE

## 2017-04-16 MED ORDER — INSULIN ASPART 100 UNIT/ML ~~LOC~~ SOLN
2.0000 [IU] | Freq: Once | SUBCUTANEOUS | Status: AC
  Administered 2017-04-17: 2 [IU] via SUBCUTANEOUS

## 2017-04-16 MED ORDER — TERBUTALINE SULFATE 1 MG/ML IJ SOLN
0.2500 mg | Freq: Once | INTRAMUSCULAR | Status: DC | PRN
Start: 2017-04-16 — End: 2017-04-17

## 2017-04-16 MED ORDER — LACTATED RINGERS IV SOLN: 500.0000 mL | INTRAVENOUS | Status: DC | PRN

## 2017-04-16 MED ORDER — OXYCODONE-ACETAMINOPHEN 5-325 MG PO TABS: 1.0000 | ORAL_TABLET | ORAL | Status: DC | PRN

## 2017-04-16 MED ORDER — OXYTOCIN 40 UNITS IN LACTATED RINGERS INFUSION - SIMPLE MED: 2.5000 [IU]/h | INTRAVENOUS | Status: DC

## 2017-04-16 MED ORDER — ONDANSETRON HCL 4 MG/2ML IJ SOLN: 4.0000 mg | Freq: Four times a day (QID) | INTRAMUSCULAR | Status: DC | PRN

## 2017-04-16 MED ORDER — SOD CITRATE-CITRIC ACID 500-334 MG/5ML PO SOLN
30.0000 mL | ORAL | Status: DC | PRN
Start: 2017-04-16 — End: 2017-04-16

## 2017-04-16 MED ORDER — CEFAZOLIN SODIUM-DEXTROSE 2-4 GM/100ML-% IV SOLN
2.0000 g | INTRAVENOUS | Status: AC
  Administered 2017-04-17: 2 g via INTRAVENOUS

## 2017-04-16 MED ORDER — OXYCODONE-ACETAMINOPHEN 5-325 MG PO TABS: 2.0000 | ORAL_TABLET | ORAL | Status: DC | PRN

## 2017-04-16 MED ORDER — LACTATED RINGERS IV SOLN
INTRAVENOUS | Status: DC
  Administered 2017-04-17: 01:00:00 via INTRAVENOUS

## 2017-04-16 MED ORDER — ACETAMINOPHEN 325 MG PO TABS: 650.0000 mg | ORAL_TABLET | ORAL | Status: DC | PRN

## 2017-04-16 MED ORDER — LIDOCAINE HCL (PF) 1 % IJ SOLN: 30.0000 mL | INTRAMUSCULAR | Status: DC | PRN

## 2017-04-16 MED ORDER — SOD CITRATE-CITRIC ACID 500-334 MG/5ML PO SOLN
30.0000 mL | ORAL | Status: DC | PRN
  Administered 2017-04-17: 30 mL via ORAL
  Filled 2017-04-16: qty 15

## 2017-04-16 MED ORDER — OXYTOCIN 40 UNITS IN LACTATED RINGERS INFUSION - SIMPLE MED
1.0000 m[IU]/min | INTRAVENOUS | Status: DC
  Administered 2017-04-16: 2 m[IU]/min via INTRAVENOUS
  Filled 2017-04-16: qty 1000

## 2017-04-16 MED ORDER — ONDANSETRON HCL 4 MG/2ML IJ SOLN
4.0000 mg | Freq: Four times a day (QID) | INTRAMUSCULAR | Status: DC | PRN
Start: 2017-04-16 — End: 2017-04-16

## 2017-04-16 MED ORDER — OXYTOCIN BOLUS FROM INFUSION: 500.0000 mL | Freq: Once | INTRAVENOUS | Status: DC

## 2017-04-16 MED ORDER — FLEET ENEMA 7-19 GM/118ML RE ENEM: 1.0000 | ENEMA | RECTAL | Status: DC | PRN

## 2017-04-16 MED ORDER — LACTATED RINGERS IV SOLN: INTRAVENOUS | Status: DC

## 2017-04-16 MED ORDER — SOD CITRATE-CITRIC ACID 500-334 MG/5ML PO SOLN: 30.0000 mL | ORAL | Status: DC | PRN

## 2017-04-16 MED ORDER — BUTORPHANOL TARTRATE 1 MG/ML IJ SOLN
1.0000 mg | INTRAMUSCULAR | Status: DC | PRN
  Administered 2017-04-16: 1 mg via INTRAVENOUS
  Filled 2017-04-16: qty 1

## 2017-04-16 NOTE — MAU Note (Signed)
Notified Dr. Henderson Cloudomblin patient presents with rupture of membranes positive fern.  Ruptured since 10:30 am yesterday morning, clear fluid.  Irregular mild contractions. 1/50/-3, fhr reactive.  MD to place admission orders.

## 2017-04-16 NOTE — H&P (Signed)
Pamela Martin is a 31 y.o. female presenting for leaking AF. Started yesterday about 10:30am-on/off. Slowed down then picked up again this afternoon. Evaluation in MAU confirms ROM. UCs more regular since about noon today. No fever, no N/V. Pregnancy complicated by Type I DM managed by Dr Talmage NapBalan. Fetal echo normal and A1C <5 at conception. Good control on multiple injections of insulin per day. OB History    Gravida Para Term Preterm AB Living   1             SAB TAB Ectopic Multiple Live Births                 Past Medical History:  Diagnosis Date  . Diabetes mellitus without complication (HCC)    shots  . Tachycardia    Past Surgical History:  Procedure Laterality Date  . WISDOM TOOTH EXTRACTION     Family History: family history includes Alcohol abuse in her brother; Breast cancer in her maternal grandmother and paternal aunt; Heart disease in her father and paternal grandfather. Social History:  reports that  has never smoked. she has never used smokeless tobacco. She reports that she does not drink alcohol or use drugs.     Maternal Diabetes: Yes:  Diabetes Type:  Pre-pregnancy Genetic Screening: Normal Maternal Ultrasounds/Referrals: Normal Fetal Ultrasounds or other Referrals:  Fetal echo Maternal Substance Abuse:  No Significant Maternal Medications:  Meds include: Other: insulin Significant Maternal Lab Results:  None Other Comments:  None  Review of Systems  Constitutional: Negative for fever.  Eyes: Negative for blurred vision.  Gastrointestinal: Negative for abdominal pain.  Neurological: Negative for headaches.   Maternal Medical History:  Reason for admission: Rupture of membranes.   Contractions: Frequency: regular.   Perceived severity is moderate.    Fetal activity: Perceived fetal activity is normal.      Dilation: 1 Effacement (%): 50 Station: -3 Exam by:: dr Debroh Sieloff Blood pressure 137/80, pulse 91, temperature 98 F (36.7 C), temperature source  Oral, resp. rate 18, height 5\' 7"  (1.702 m), weight 207 lb (93.9 kg), last menstrual period 07/20/2016, SpO2 99 %. Maternal Exam:  Uterine Assessment: Contraction strength is moderate.  Contraction frequency is regular.   Abdomen: Patient reports no abdominal tenderness. Fetal presentation: vertex     Fetal Exam Fetal State Assessment: Category I - tracings are normal.     Physical Exam  Cardiovascular: Normal rate and regular rhythm.  Respiratory: Effort normal and breath sounds normal.  GI: Soft. There is no tenderness.  Neurological: She has normal reflexes.    Cx 1/50/cannot feel presenting part Bedside U/S confirms vtx UCs q2-3 min  Prenatal labs: ABO, Rh: --/--/O POS (11/18 1849) Antibody: NEG (11/18 1849) Rubella: Immune (04/19 0000) RPR: Nonreactive (04/19 0000)  HBsAg: Negative (04/19 0000)  HIV: Non-reactive (04/19 0000)  GBS: Negative (11/13 0000)  Glucose = 80s  Results for orders placed or performed during the hospital encounter of 04/16/17 (from the past 24 hour(s))  Fern Test     Status: Abnormal   Collection Time: 04/16/17  5:53 PM  Result Value Ref Range   POCT Fern Test    CBC     Status: None   Collection Time: 04/16/17  6:49 PM  Result Value Ref Range   WBC 7.9 4.0 - 10.5 K/uL   RBC 4.34 3.87 - 5.11 MIL/uL   Hemoglobin 12.5 12.0 - 15.0 g/dL   HCT 16.137.8 09.636.0 - 04.546.0 %   MCV 87.1 78.0 - 100.0  fL   MCH 28.8 26.0 - 34.0 pg   MCHC 33.1 30.0 - 36.0 g/dL   RDW 57.813.1 46.911.5 - 62.915.5 %   Platelets 157 150 - 400 K/uL  Type and screen [facility]     Status: None   Collection Time: 04/16/17  6:49 PM  Result Value Ref Range   ABO/RH(D) O POS    Antibody Screen NEG    Sample Expiration 04/19/2017   Glucose, capillary     Status: None   Collection Time: 04/16/17  7:12 PM  Result Value Ref Range   Glucose-Capillary 87 65 - 99 mg/dL   D/W Dr Charlotta NewtonNewman,MFM, who agrees with pitocin and close monitoring for prompt response.    Assessment/Plan: 31 yo G1P0 @ 38 4/7 weeks Prelabor PROM - no current evidence of chorioamnionitis DM  Will begin pitocin augmentation D/W patient plan and risks All questions answered-she states she understands and agrees Roselle LocusJames E Tequan Redmon II 04/16/2017, 8:01 PM

## 2017-04-16 NOTE — Anesthesia Pain Management Evaluation Note (Signed)
  [facility] Pain Management Visit Note  Patient: Pamela Martin, 31 y.o., female  "Hello I am a member of the anesthesia team at [facility]. We have an anesthesia team available at all times to provide care throughout the hospital, including epidural management and anesthesia for C-section. I don't know your plan for the delivery whether it a natural birth, water birth, IV sedation, nitrous supplementation, doula or epidural, but we want to meet your pain goals."   1.Was your pain managed to your expectations on prior hospitalizations?   No prior hospitalizations  2.What is your expectation for pain management during this hospitalization?     Epidural  3.How can we help you reach that goal? epidural  Record the patient's initial score and the patient's pain goal.   Pain: 6  Pain Goal: 8 [facility] wants you to be able to say your pain was always managed very well.  [name] 04/16/2017

## 2017-04-16 NOTE — Progress Notes (Signed)
FHT 150s + accels UCs q2-3 min Cx no change D/W patient high station and prolonged ROM without progress and increasing risk of infection Rec: cesarean section. Risks reviewed including infection, organ damage, bleeding/transfusion-HIV/Hep, DVT/PE, pneumonia. All questions answered. Patient states she understands and agrees.

## 2017-04-16 NOTE — Progress Notes (Signed)
U/S in office 04/13/17 = EFW 8# 9oz, vtx

## 2017-04-16 NOTE — MAU Note (Signed)
Thinks that her water broke,  Trickle since yesterday morning, around 1030-stopped and started back this morning. Few contractions yesterday. Started back this morning around 0530 has since tapered off.  Type 1 diabetic.

## 2017-04-17 ENCOUNTER — Encounter (HOSPITAL_COMMUNITY)
Admission: AD | Disposition: A | Discharge status: Home / Self Care | Payer: Self-pay | Source: Ambulatory Visit | Attending: Obstetrics and Gynecology

## 2017-04-17 ENCOUNTER — Inpatient Hospital Stay (HOSPITAL_COMMUNITY): Payer: BC Managed Care – PPO | Admitting: Anesthesiology

## 2017-04-17 ENCOUNTER — Encounter (HOSPITAL_COMMUNITY): Payer: Self-pay | Admitting: Anesthesiology

## 2017-04-17 LAB — CBC
HEMATOCRIT: 38.1 % (ref 36.0–46.0)
HEMOGLOBIN: 12.6 g/dL (ref 12.0–15.0)
MCH: 28.7 pg (ref 26.0–34.0)
MCHC: 33.1 g/dL (ref 30.0–36.0)
MCV: 86.8 fL (ref 78.0–100.0)
Platelets: 167 10*3/uL (ref 150–400)
RBC: 4.39 MIL/uL (ref 3.87–5.11)
RDW: 13 % (ref 11.5–15.5)
WBC: 15.1 10*3/uL — AB (ref 4.0–10.5)

## 2017-04-17 LAB — GLUCOSE, CAPILLARY
GLUCOSE-CAPILLARY: 187 mg/dL — AB (ref 65–99)
GLUCOSE-CAPILLARY: 217 mg/dL — AB (ref 65–99)
GLUCOSE-CAPILLARY: 175 mg/dL — AB (ref 65–99)
GLUCOSE-CAPILLARY: 141 mg/dL — AB (ref 65–99)
GLUCOSE-CAPILLARY: 99 mg/dL (ref 65–99)
GLUCOSE-CAPILLARY: 149 mg/dL — AB (ref 65–99)
GLUCOSE-CAPILLARY: 134 mg/dL — AB (ref 65–99)
GLUCOSE-CAPILLARY: 120 mg/dL — AB (ref 65–99)
Glucose-Capillary: 175 mg/dL — ABNORMAL HIGH (ref 65–99)
Glucose-Capillary: 127 mg/dL — ABNORMAL HIGH (ref 65–99)
Glucose-Capillary: 79 mg/dL (ref 65–99)
Glucose-Capillary: 117 mg/dL — ABNORMAL HIGH (ref 65–99)
Glucose-Capillary: 70 mg/dL (ref 65–99)

## 2017-04-17 LAB — ABO/RH: ABO/RH(D): O POS

## 2017-04-17 LAB — RPR: RPR: NONREACTIVE

## 2017-04-17 SURGERY — Surgical Case
Anesthesia: Spinal

## 2017-04-17 MED ORDER — SIMETHICONE 80 MG PO CHEW: 80.0000 mg | CHEWABLE_TABLET | ORAL | Status: DC | PRN

## 2017-04-17 MED ORDER — MEPERIDINE HCL 25 MG/ML IJ SOLN: 6.2500 mg | INTRAMUSCULAR | Status: DC | PRN

## 2017-04-17 MED ORDER — COCONUT OIL OIL
1.0000 "application " | TOPICAL_OIL | Status: DC | PRN
  Filled 2017-04-17: qty 120

## 2017-04-17 MED ORDER — ACETAMINOPHEN 325 MG PO TABS
650.0000 mg | ORAL_TABLET | ORAL | Status: DC | PRN
  Administered 2017-04-17 – 2017-04-18 (×2): 650 mg via ORAL
  Filled 2017-04-17 (×3): qty 2

## 2017-04-17 MED ORDER — DEXAMETHASONE SODIUM PHOSPHATE 10 MG/ML IJ SOLN
INTRAMUSCULAR | Status: DC | PRN
  Administered 2017-04-17: 10 mg via INTRAVENOUS

## 2017-04-17 MED ORDER — DEXTROSE IN LACTATED RINGERS 5 % IV SOLN
INTRAVENOUS | Status: DC
  Administered 2017-04-17: 03:00:00 via INTRAVENOUS

## 2017-04-17 MED ORDER — SCOPOLAMINE 1 MG/3DAYS TD PT72
MEDICATED_PATCH | TRANSDERMAL | Status: AC
  Filled 2017-04-17: qty 1

## 2017-04-17 MED ORDER — DIPHENHYDRAMINE HCL 25 MG PO CAPS
25.0000 mg | ORAL_CAPSULE | ORAL | Status: DC | PRN
  Filled 2017-04-17: qty 1

## 2017-04-17 MED ORDER — INSULIN ASPART 100 UNIT/ML ~~LOC~~ SOLN
3.0000 [IU] | Freq: Three times a day (TID) | SUBCUTANEOUS | Status: DC
  Administered 2017-04-17: 3 [IU] via SUBCUTANEOUS

## 2017-04-17 MED ORDER — TETANUS-DIPHTH-ACELL PERTUSSIS 5-2.5-18.5 LF-MCG/0.5 IM SUSP: 0.5000 mL | Freq: Once | INTRAMUSCULAR | Status: DC

## 2017-04-17 MED ORDER — MORPHINE SULFATE (PF) 0.5 MG/ML IJ SOLN
INTRAMUSCULAR | Status: DC | PRN
  Administered 2017-04-17: .2 mg via INTRATHECAL

## 2017-04-17 MED ORDER — ONDANSETRON HCL 4 MG/2ML IJ SOLN: 4.0000 mg | Freq: Three times a day (TID) | INTRAMUSCULAR | Status: DC | PRN

## 2017-04-17 MED ORDER — NALOXONE HCL 0.4 MG/ML IJ SOLN
1.0000 ug/kg/h | INTRAVENOUS | Status: DC | PRN
  Filled 2017-04-17: qty 5

## 2017-04-17 MED ORDER — FENTANYL CITRATE (PF) 100 MCG/2ML IJ SOLN
INTRAMUSCULAR | Status: AC
  Filled 2017-04-17: qty 2

## 2017-04-17 MED ORDER — NALBUPHINE HCL 10 MG/ML IJ SOLN: 5.0000 mg | INTRAMUSCULAR | Status: DC | PRN

## 2017-04-17 MED ORDER — MEPERIDINE HCL 25 MG/ML IJ SOLN
INTRAMUSCULAR | Status: AC
  Filled 2017-04-17: qty 1

## 2017-04-17 MED ORDER — DIPHENHYDRAMINE HCL 50 MG/ML IJ SOLN: 12.5000 mg | INTRAMUSCULAR | Status: DC | PRN

## 2017-04-17 MED ORDER — ZOLPIDEM TARTRATE 5 MG PO TABS: 5.0000 mg | ORAL_TABLET | Freq: Every evening | ORAL | Status: DC | PRN

## 2017-04-17 MED ORDER — PRENATAL MULTIVITAMIN CH
1.0000 | ORAL_TABLET | Freq: Every day | ORAL | Status: DC
  Administered 2017-04-17 – 2017-04-18 (×2): 1 via ORAL
  Filled 2017-04-17 (×2): qty 1

## 2017-04-17 MED ORDER — MEPERIDINE HCL 25 MG/ML IJ SOLN
INTRAMUSCULAR | Status: DC | PRN
  Administered 2017-04-17 (×2): 12.5 mg via INTRAVENOUS

## 2017-04-17 MED ORDER — BUPIVACAINE IN DEXTROSE 0.75-8.25 % IT SOLN
INTRATHECAL | Status: DC | PRN
  Administered 2017-04-17: 1.4 mL via INTRATHECAL

## 2017-04-17 MED ORDER — IBUPROFEN 600 MG PO TABS
600.0000 mg | ORAL_TABLET | Freq: Four times a day (QID) | ORAL | Status: DC
  Administered 2017-04-17 – 2017-04-19 (×8): 600 mg via ORAL
  Filled 2017-04-17 (×8): qty 1

## 2017-04-17 MED ORDER — INSULIN ASPART 100 UNIT/ML ~~LOC~~ SOLN: 0.0000 [IU] | Freq: Three times a day (TID) | SUBCUTANEOUS | Status: DC

## 2017-04-17 MED ORDER — DEXAMETHASONE SODIUM PHOSPHATE 10 MG/ML IJ SOLN
INTRAMUSCULAR | Status: AC
  Filled 2017-04-17: qty 1

## 2017-04-17 MED ORDER — KETOROLAC TROMETHAMINE 30 MG/ML IJ SOLN: 30.0000 mg | Freq: Four times a day (QID) | INTRAMUSCULAR | Status: AC | PRN

## 2017-04-17 MED ORDER — KETOROLAC TROMETHAMINE 30 MG/ML IJ SOLN
30.0000 mg | Freq: Four times a day (QID) | INTRAMUSCULAR | Status: AC | PRN
Start: 2017-04-17 — End: 2017-04-18

## 2017-04-17 MED ORDER — ONDANSETRON HCL 4 MG/2ML IJ SOLN
INTRAMUSCULAR | Status: AC
  Filled 2017-04-17: qty 2

## 2017-04-17 MED ORDER — METOPROLOL TARTRATE 25 MG PO TABS
25.0000 mg | ORAL_TABLET | Freq: Two times a day (BID) | ORAL | Status: DC
  Administered 2017-04-17 – 2017-04-18 (×4): 25 mg via ORAL
  Filled 2017-04-17 (×5): qty 1

## 2017-04-17 MED ORDER — NALBUPHINE HCL 10 MG/ML IJ SOLN: 5.0000 mg | Freq: Once | INTRAMUSCULAR | Status: DC | PRN

## 2017-04-17 MED ORDER — MORPHINE SULFATE (PF) 0.5 MG/ML IJ SOLN
INTRAMUSCULAR | Status: AC
  Filled 2017-04-17: qty 10

## 2017-04-17 MED ORDER — ACETAMINOPHEN 500 MG PO TABS
1000.0000 mg | ORAL_TABLET | Freq: Four times a day (QID) | ORAL | Status: AC
  Administered 2017-04-17 (×3): 1000 mg via ORAL
  Filled 2017-04-17 (×3): qty 2

## 2017-04-17 MED ORDER — OXYTOCIN 40 UNITS IN LACTATED RINGERS INFUSION - SIMPLE MED: 2.5000 [IU]/h | INTRAVENOUS | Status: AC

## 2017-04-17 MED ORDER — SCOPOLAMINE 1 MG/3DAYS TD PT72: 1.0000 | MEDICATED_PATCH | Freq: Once | TRANSDERMAL | Status: DC

## 2017-04-17 MED ORDER — FENTANYL CITRATE (PF) 100 MCG/2ML IJ SOLN
INTRAMUSCULAR | Status: DC | PRN
  Administered 2017-04-17: 20 ug via INTRATHECAL

## 2017-04-17 MED ORDER — WITCH HAZEL-GLYCERIN EX PADS: 1.0000 "application " | MEDICATED_PAD | CUTANEOUS | Status: DC | PRN

## 2017-04-17 MED ORDER — MENTHOL 3 MG MT LOZG: 1.0000 | LOZENGE | OROMUCOSAL | Status: DC | PRN

## 2017-04-17 MED ORDER — KETOROLAC TROMETHAMINE 30 MG/ML IJ SOLN: 30.0000 mg | Freq: Once | INTRAMUSCULAR | Status: DC | PRN

## 2017-04-17 MED ORDER — SODIUM CHLORIDE 0.9 % IV SOLN
INTRAVENOUS | Status: DC
  Administered 2017-04-17: 3.1 [IU]/h via INTRAVENOUS
  Administered 2017-04-17: 1.3 [IU]/h via INTRAVENOUS
  Filled 2017-04-17: qty 1

## 2017-04-17 MED ORDER — HYDROMORPHONE HCL 1 MG/ML IJ SOLN: 0.2500 mg | INTRAMUSCULAR | Status: DC | PRN

## 2017-04-17 MED ORDER — OXYTOCIN 10 UNIT/ML IJ SOLN
INTRAMUSCULAR | Status: AC
  Filled 2017-04-17: qty 4

## 2017-04-17 MED ORDER — SODIUM CHLORIDE 0.9% FLUSH: 3.0000 mL | INTRAVENOUS | Status: DC | PRN

## 2017-04-17 MED ORDER — LACTATED RINGERS IV SOLN
INTRAVENOUS | Status: DC | PRN
  Administered 2017-04-17: 40 [IU] via INTRAVENOUS

## 2017-04-17 MED ORDER — OXYCODONE HCL 5 MG PO TABS: 10.0000 mg | ORAL_TABLET | ORAL | Status: DC | PRN

## 2017-04-17 MED ORDER — INSULIN GLARGINE 100 UNIT/ML ~~LOC~~ SOLN
10.0000 [IU] | Freq: Every day | SUBCUTANEOUS | Status: DC
  Administered 2017-04-17: 10 [IU] via SUBCUTANEOUS
  Filled 2017-04-17 (×2): qty 0.1

## 2017-04-17 MED ORDER — SCOPOLAMINE 1 MG/3DAYS TD PT72
MEDICATED_PATCH | TRANSDERMAL | Status: DC | PRN
  Administered 2017-04-17: 1 via TRANSDERMAL

## 2017-04-17 MED ORDER — LACTATED RINGERS IV SOLN: INTRAVENOUS | Status: DC

## 2017-04-17 MED ORDER — INSULIN ASPART 100 UNIT/ML ~~LOC~~ SOLN: 0.0000 [IU] | Freq: Every day | SUBCUTANEOUS | Status: DC

## 2017-04-17 MED ORDER — SIMETHICONE 80 MG PO CHEW
80.0000 mg | CHEWABLE_TABLET | Freq: Three times a day (TID) | ORAL | Status: DC
  Administered 2017-04-17 – 2017-04-18 (×4): 80 mg via ORAL
  Filled 2017-04-17 (×4): qty 1

## 2017-04-17 MED ORDER — SENNOSIDES-DOCUSATE SODIUM 8.6-50 MG PO TABS
2.0000 | ORAL_TABLET | ORAL | Status: DC
  Administered 2017-04-17 – 2017-04-18 (×2): 2 via ORAL
  Filled 2017-04-17 (×2): qty 2

## 2017-04-17 MED ORDER — LACTATED RINGERS IV SOLN
INTRAVENOUS | Status: DC | PRN
  Administered 2017-04-17 (×2): via INTRAVENOUS

## 2017-04-17 MED ORDER — NALOXONE HCL 0.4 MG/ML IJ SOLN: 0.4000 mg | INTRAMUSCULAR | Status: DC | PRN

## 2017-04-17 MED ORDER — OXYCODONE HCL 5 MG PO TABS
5.0000 mg | ORAL_TABLET | ORAL | Status: DC | PRN
  Administered 2017-04-18 (×2): 5 mg via ORAL
  Filled 2017-04-17 (×2): qty 1

## 2017-04-17 MED ORDER — DIPHENHYDRAMINE HCL 25 MG PO CAPS: 25.0000 mg | ORAL_CAPSULE | Freq: Four times a day (QID) | ORAL | Status: DC | PRN

## 2017-04-17 MED ORDER — PROMETHAZINE HCL 25 MG/ML IJ SOLN: 6.2500 mg | INTRAMUSCULAR | Status: DC | PRN

## 2017-04-17 MED ORDER — KETOROLAC TROMETHAMINE 30 MG/ML IJ SOLN
INTRAMUSCULAR | Status: AC
  Administered 2017-04-17: 30 mg via INTRAMUSCULAR
  Filled 2017-04-17: qty 1

## 2017-04-17 MED ORDER — DIBUCAINE 1 % RE OINT: 1.0000 "application " | TOPICAL_OINTMENT | RECTAL | Status: DC | PRN

## 2017-04-17 MED ORDER — INSULIN ASPART 100 UNIT/ML ~~LOC~~ SOLN
0.0000 [IU] | Freq: Three times a day (TID) | SUBCUTANEOUS | Status: DC
  Administered 2017-04-18: 2 [IU] via SUBCUTANEOUS
  Filled 2017-04-17: qty 0.15

## 2017-04-17 MED ORDER — ONDANSETRON HCL 4 MG/2ML IJ SOLN
INTRAMUSCULAR | Status: DC | PRN
  Administered 2017-04-17: 4 mg via INTRAVENOUS

## 2017-04-17 MED ORDER — SIMETHICONE 80 MG PO CHEW
80.0000 mg | CHEWABLE_TABLET | ORAL | Status: DC
  Administered 2017-04-17 – 2017-04-18 (×2): 80 mg via ORAL
  Filled 2017-04-17 (×2): qty 1

## 2017-04-17 SURGICAL SUPPLY — 34 items
BENZOIN TINCTURE PRP APPL 2/3 (GAUZE/BANDAGES/DRESSINGS) ×3 IMPLANT
CHLORAPREP W/TINT 26ML (MISCELLANEOUS) ×3 IMPLANT
CLAMP CORD UMBIL (MISCELLANEOUS) IMPLANT
CLOSURE WOUND 1/2 X4 (GAUZE/BANDAGES/DRESSINGS) ×1
CLOTH BEACON ORANGE TIMEOUT ST (SAFETY) ×3 IMPLANT
CONTAINER PREFILL 10% NBF 15ML (MISCELLANEOUS) IMPLANT
DERMABOND ADVANCED (GAUZE/BANDAGES/DRESSINGS)
DERMABOND ADVANCED .7 DNX12 (GAUZE/BANDAGES/DRESSINGS) IMPLANT
DRSG OPSITE POSTOP 4X10 (GAUZE/BANDAGES/DRESSINGS) ×3 IMPLANT
ELECT REM PT RETURN 9FT ADLT (ELECTROSURGICAL) ×3
ELECTRODE REM PT RTRN 9FT ADLT (ELECTROSURGICAL) ×1 IMPLANT
EXTRACTOR VACUUM M CUP 4 TUBE (SUCTIONS) IMPLANT
EXTRACTOR VACUUM M CUP 4' TUBE (SUCTIONS)
GLOVE BIO SURGEON STRL SZ7.5 (GLOVE) ×3 IMPLANT
GLOVE BIOGEL PI IND STRL 7.0 (GLOVE) ×1 IMPLANT
GLOVE BIOGEL PI INDICATOR 7.0 (GLOVE) ×2
GOWN STRL REUS W/TWL LRG LVL3 (GOWN DISPOSABLE) ×6 IMPLANT
KIT ABG SYR 3ML LUER SLIP (SYRINGE) ×3 IMPLANT
NEEDLE HYPO 25X5/8 SAFETYGLIDE (NEEDLE) ×3 IMPLANT
NS IRRIG 1000ML POUR BTL (IV SOLUTION) ×3 IMPLANT
PACK C SECTION WH (CUSTOM PROCEDURE TRAY) ×3 IMPLANT
PAD OB MATERNITY 4.3X12.25 (PERSONAL CARE ITEMS) ×3 IMPLANT
PENCIL SMOKE EVAC W/HOLSTER (ELECTROSURGICAL) ×3 IMPLANT
STRIP CLOSURE SKIN 1/2X4 (GAUZE/BANDAGES/DRESSINGS) ×2 IMPLANT
SUT MNCRL 0 VIOLET CTX 36 (SUTURE) ×4 IMPLANT
SUT MONOCRYL 0 CTX 36 (SUTURE) ×8
SUT PDS AB 0 CTX 60 (SUTURE) ×3 IMPLANT
SUT PLAIN 0 NONE (SUTURE) IMPLANT
SUT PLAIN 2 0 (SUTURE)
SUT PLAIN 2 0 XLH (SUTURE) IMPLANT
SUT PLAIN ABS 2-0 CT1 27XMFL (SUTURE) IMPLANT
SUT VIC AB 4-0 KS 27 (SUTURE) ×3 IMPLANT
TOWEL OR 17X24 6PK STRL BLUE (TOWEL DISPOSABLE) ×3 IMPLANT
TRAY FOLEY BAG SILVER LF 14FR (SET/KITS/TRAYS/PACK) ×3 IMPLANT

## 2017-04-17 NOTE — Anesthesia Preprocedure Evaluation (Signed)
Anesthesia Evaluation  Patient identified by MRN, date of birth, ID band Patient awake    Reviewed: Allergy & Precautions, H&P , NPO status , Patient's Chart, lab work & pertinent test results, Unable to perform ROS - Chart review only  Airway Mallampati: I  TM Distance: >3 FB Neck ROM: full    Dental no notable dental hx. (+) Teeth Intact   Pulmonary neg pulmonary ROS,    Pulmonary exam normal breath sounds clear to auscultation       Cardiovascular negative cardio ROS Normal cardiovascular exam+ dysrhythmias Supra Ventricular Tachycardia  Rhythm:regular Rate:Normal     Neuro/Psych negative neurological ROS  negative psych ROS   GI/Hepatic negative GI ROS, Neg liver ROS,   Endo/Other  diabetes, Type 1, Insulin Dependent  Renal/GU negative Renal ROS  negative genitourinary   Musculoskeletal negative musculoskeletal ROS (+)   Abdominal (+) + obese,   Peds  Hematology negative hematology ROS (+)   Anesthesia Other Findings   Reproductive/Obstetrics (+) Pregnancy                             Anesthesia Physical Anesthesia Plan  ASA: II  Anesthesia Plan: Spinal   Post-op Pain Management:    Induction:   PONV Risk Score and Plan: 3 and Scopolamine patch - Pre-op, Dexamethasone, Ondansetron and Treatment may vary due to age or medical condition  Airway Management Planned: Natural Airway and Nasal Cannula  Additional Equipment:   Intra-op Plan:   Post-operative Plan:   Informed Consent: I have reviewed the patients History and Physical, chart, labs and discussed the procedure including the risks, benefits and alternatives for the proposed anesthesia with the patient or authorized representative who has indicated his/her understanding and acceptance.     Plan Discussed with: CRNA and Surgeon  Anesthesia Plan Comments:         Anesthesia Quick Evaluation

## 2017-04-17 NOTE — Progress Notes (Signed)
Orthostatics completed, ambulated to the bathroom.  Educated pt on peri care and changed pad.  Ambulated back to bed without difficulty.

## 2017-04-17 NOTE — Progress Notes (Signed)
Subjective: Postpartum Day 1: Cesarean Delivery Patient reports not having tried anything to eat yet. Tolerating water. No N/V. Foley remains in place. Hasn't yet ambulated.    Objective: Vital signs in last 24 hours: Temp:  [98 F (36.7 C)-99.3 F (37.4 C)] 99.3 F (37.4 C) (11/19 0759) Pulse Rate:  [66-108] 68 (11/19 0905) Resp:  [16-21] 17 (11/19 0759) BP: (102-147)/(56-91) 122/57 (11/19 0759) SpO2:  [93 %-99 %] 98 % (11/19 0905) Weight:  [93.9 kg (207 lb)-94 kg (207 lb 4 oz)] 93.9 kg (207 lb) (11/18 1832)  Physical Exam:  General: alert, cooperative and appears stated age 62Lochia: appropriate Uterine Fundus: firm Incision: healing well, no significant drainage, no dehiscence, no significant erythema DVT Evaluation: No evidence of DVT seen on physical exam. Negative Homan's sign. No cords or calf tenderness. No significant calf/ankle edema.  Recent Labs    04/16/17 1849 04/17/17 0520  HGB 12.5 12.6  HCT 37.8 38.1    Assessment/Plan: Status post Cesarean section. Doing well postoperatively.  Continue current care. Pt w/h/o T1DM, currently on IV insulin stabilizer as she has not yet eaten. Once starts to eat, anticipate transition to home dosing. DM recs:  At time of transition off IV insulin, please consider ordering Lantus 10 units Q24H (based on 93.9 kg x 0.1 units). NURSING: Once Lantus is ordered, please give Lantus and continue IV insulin drip for 2 hours and then discontinue and use Novolog correction scale if needed. Correction (SSI): At time of transition off IV insulin, please consider using the regular Glycemic Control order set to order Novolog 0-9 units TID with meals and Novolog 0-5 units QHS. Insulin - Meal Coverage: At time of transition off IV insulin, please consider using the regular Glycemic Control order set to order Novolog 3 units TID with meals for meal coverage if patient eats at least 50% of meals.    Pamela Martin Pamela Martin 04/17/2017, 9:10 AM

## 2017-04-17 NOTE — Brief Op Note (Signed)
04/16/2017 - 04/17/2017  1:45 AM  PATIENT:  Pamela Martin  31 y.o. female  PRE-OPERATIVE DIAGNOSIS:  2455w5d, failure to dilate  POST-OPERATIVE DIAGNOSIS:  1855w5d, failure to dilate  PROCEDURE:  Procedure(s): CESAREAN SECTION (N/A)  SURGEON:  Surgeon(s) and Role:    * Harold Hedgeomblin, Israel Werts, MD - Primary  PHYSICIAN ASSISTANT:   ASSISTANTS: none   ANESTHESIA:   spinal  EBL:  717 mL   BLOOD ADMINISTERED:none  DRAINS: Urinary Catheter (Foley)   LOCAL MEDICATIONS USED:  NONE  SPECIMEN:  Source of Specimen:  placenta  DISPOSITION OF SPECIMEN:  PATHOLOGY  COUNTS:  YES  TOURNIQUET:  * No tourniquets in log *  DICTATION: .Other Dictation: Dictation Number [id number]  PLAN OF CARE: Admit to inpatient   PATIENT DISPOSITION:  PACU - hemodynamically stable.   Delay start of Pharmacological VTE agent (>24hrs) due to surgical blood loss or risk of bleeding: not applicable

## 2017-04-17 NOTE — Anesthesia Postprocedure Evaluation (Signed)
Anesthesia Post Note  Patient: Berta Minormily Faidley  Procedure(s) Performed: CESAREAN SECTION (N/A )     Patient location during evaluation: Mother Baby Anesthesia Type: Spinal Level of consciousness: awake and alert and oriented Pain management: pain level controlled Vital Signs Assessment: post-procedure vital signs reviewed and stable Respiratory status: spontaneous breathing and nonlabored ventilation Cardiovascular status: stable Postop Assessment: no headache, no apparent nausea or vomiting, no backache, adequate PO intake, spinal receding and patient able to bend at knees Anesthetic complications: no    Last Vitals:  Vitals:   04/17/17 1059 04/17/17 1204  BP:  110/63  Pulse: 74 65  Resp:    Temp:  37.1 C  SpO2: 95% 97%    Last Pain:  Vitals:   04/17/17 1204  TempSrc: Oral  PainSc: 0-No pain   Pain Goal:                 Land O'LakesMalinova,Trude Cansler Hristova

## 2017-04-17 NOTE — Op Note (Signed)
NAMBerta Minor:  Renaldo, Siomara                  ACCOUNT NO.:  [id number]  MEDICAL RECORD NO.:  [id number]  LOCATION:  [facility]                          FACILITY:  [facility]  PHYSICIAN:  Guy SandiferJames E. Henderson Cloudomblin, M.D. DATE OF BIRTH:  1985/07/01  DATE OF PROCEDURE:  04/17/2017 DATE OF DISCHARGE:                              OPERATIVE REPORT   PREOPERATIVE DIAGNOSES: 1. Intrauterine pregnancy at 2438 and 5/7th weeks estimated gestational     age. 2. Arrest of dilation. 3. Diabetes.  POSTOPERATIVE DIAGNOSES: 1. Intrauterine pregnancy at 1838 and 5/7th weeks estimated gestational     age. 2. Arrest of dilation. 3. Diabetes.  PROCEDURE:  Primary low-transverse cesarean section.  SURGEON:  Guy SandiferJames E. Henderson Cloudomblin, MD.  ANESTHESIA:  Spinal, Quillian QuinceJohn F. Hatchett, MD.  ESTIMATED BLOOD LOSS:  750 mL.  SPECIMENS:  Placenta to Pathology.  FINDINGS:  Viable female infant.  Apgars, arterial cord pH, birth weight pending.  INDICATIONS AND CONSENT:  This patient is a 31 year old, G1, P0, with pregnancy complicated by diabetes, on insulin.  She presents to the hospital on April 16, 2017 with a history of intermittent leaking and rupture of membranes since 10:30 the previous day.  Rupture of membranes was confirmed.  Cervix is 1 cm dilated, approximately 50% effaced with the presenting part out of the pelvis.  Ultrasound confirms vertex.  Her group B strep culture was negative.  She is afebrile.  Uterus was nontender and she has a normal white count.  She was beginning to contract.  Consultation with MFM was done.  Pitocin augmentation was initiated.  After about 2-1/2 to 3 hours of contractions, there was no descent of the vertex at all.  Due to the prolonged rupture of membranes, lack of any progress, and risk of infection, cesarean section was recommended.  Potential risks and complications were discussed preoperatively including but not limited to, infection, organ damage, bleeding requiring transfusion of blood products  with HIV and hepatitis acquisition, DVT, PE, pneumonia.  The patient states she understands and agrees and consent was signed on the chart.  DESCRIPTION OF PROCEDURE:  The patient was taken to the operating room where she was identified, spinal anesthetic was placed per Dr. Arby BarretteHatchett, and she was placed in a dorsal supine position with 15 degree left lateral wedge.  She was then prepped vaginally, Foley catheter was placed, and she was prepped abdominally.  Time-out was undertaken. After a 3-minute drying time, she was draped in a sterile fashion. After testing for adequate spinal anesthesia, skin was entered through a Pfannenstiel incision and dissection was carried out in layers to the peritoneum.  Peritoneum was incised and extended as well.  Vesicouterine peritoneum was taken down cephalolaterally, bladder flap developed, and bladder blade was placed.  Uterus was incised in a low transverse manner.  The uterine cavity was entered bluntly with a hemostat.  The uterine incision was extended cephalolaterally with the fingers.  Vertex was elevated through the incision with 1 gentle pull with a mushroom vacuum extractor with no pop-offs.  Loose nuchal cord x1 was reduced. Baby was then delivered with good cry and tone noted.  After 1 minute waiting time, the cord was clamped and  cut and the baby was handed to waiting Pediatrics team.  The placenta was manually delivered and sent to Pathology.  Uterine cavity was clean.  Uterus was closed in 2 running- locking imbricating layers of 0 Monocryl, which achieves good hemostasis.  Copious lavage was carried out.  Anterior peritoneum was then closed in a running fashion, which was also used to reapproximate the pyramidalis muscle in the midline with 0 Monocryl.  The anterior rectus fascia was then closed in a running fashion with a 0 looped PDS suture.  Subcutaneous layer was closed with interrupted plain 2-0 suture and the skin was closed in a  subcuticular fashion with a 4-0 Vicryl on a Keith needle.  Benzoin, Steri-Strips, dressing were applied.  All counts were correct and the patient was transferred to recovery room in stable condition.     Guy SandiferJames E. Henderson Cloudomblin, M.D.     JET/MEDQ  D:  04/17/2017  T:  04/17/2017  Job:  [id number]

## 2017-04-17 NOTE — Progress Notes (Signed)
Inpatient Diabetes Program Recommendations  AACE/ADA: New Consensus Statement on Inpatient Glycemic Control (2015)  Target Ranges:  Prepandial:   less than 140 mg/dL      Peak postprandial:   less than 180 mg/dL (1-2 hours)      Critically ill patients:  140 - 180 mg/dL   Results for Berta MinorOLK, Tari (MRN [id number]) as of 04/17/2017 07:59  Ref. Range 04/16/2017 19:12 04/16/2017 23:12 04/17/2017 04:48 04/17/2017 05:54 04/17/2017 06:56 04/17/2017 07:59  Glucose-Capillary Latest Ref Range: 65 - 99 mg/dL 87 409159 (H) 811175 (H) 914175 (H) 141 (H) 127 (H)   Review of Glycemic Control  Diabetes history: DM1 Outpatient Diabetes medications: Tresiba 14 units QHS, Novolog 1 unit for every 5 grams of carbohydrates, Novolog 1 unit for every 50 mg/dl above target glucose Current orders for Inpatient glycemic control: Novolin R insulin drip  Inpatient Diabetes Program Recommendations: Insulin - Basal: At time of transition off IV insulin, please consider ordering Lantus 10 units Q24H (based on 93.9 kg x 0.1 units). NURSING: Once Lantus is ordered, please give Lantus and continue IV insulin drip for 2 hours and then discontinue and use Novolog correction scale if needed. Correction (SSI): At time of transition off IV insulin, please consider using the regular Glycemic Control order set to order Novolog 0-9 units TID with meals and Novolog 0-5 units QHS. Insulin - Meal Coverage: At time of transition off IV insulin, please consider using the regular Glycemic Control order set to order Novolog 3 units TID with meals for meal coverage if patient eats at least 50% of meals.  NOTE: Spoke with patient over phone regarding DM control and regimen. Patient states that she is seeing Dr. Talmage NapBalan for DM management and she was taking Tresiba 14 units QHS, Novolog 1 unit for every 5 grams of carbohydrates, Novolog 1 unit for every 50 mg/dl above target glucose for DM control prior to admission. Patient reports that Dr. Talmage NapBalan  recommended for her to change her carbohydrate coverage to 1:7 (1 unit for every 7 grams of carbs) and continue her same sensitivity 1:50 (1 unit drops glucose 50 mg/dl) following delivery. Patient reports that she was experiencing some hypoglycemia at home prior to coming to the hospital.  This is patient's first pregnancy so discussed how insulin needs change following delivery, with breast feeding, and how she may need more insulin as she gets further from her delivery and closer to her baseline prior to pregnancy. Patient states that she plans to breastfeed. Encouraged patient to check glucose prior to breastfeeding and to have a snack if glucose was trending on low side of normal. Discussed hospital protocol with the insulin drip following delivery and informed patient of recommendations being made. Patient states that she is feeling hungry and would like to eat. Patient verbalized understanding of information discussed and she has no questions at this time. Called unit and spoke with Huntley DecSara, RN regarding recommendations.  Thanks, Orlando PennerMarie Aubriauna Riner, RN, MSN, CDE Diabetes Coordinator Inpatient Diabetes Program [phone] (Team Pager from 8am to 5pm)

## 2017-04-17 NOTE — Progress Notes (Signed)
Called Dr. Elon SpannerLeger regarding patient's blood sugar being within normal range (CBG-99).  Pt has not ordered food this morning but is planning on ordering a meal soon.  Per MD, call when patient has eaten.  Will continue to monitor patient.

## 2017-04-17 NOTE — Progress Notes (Signed)
Dr. Elon SpannerLeger notified that pt has had breakfast.  Per MD, administer Lantus 10 units now and d/c drip after 2 hours.  Wait to give meal coverage until next meal.  MD to call diabetes educator to clarify, will call with new orders if needed.  Will continue to monitor.

## 2017-04-17 NOTE — Transfer of Care (Signed)
Immediate Anesthesia Transfer of Care Note  Patient: Pamela Martin  Procedure(s) Performed: CESAREAN SECTION (N/A )  Patient Location: PACU  Anesthesia Type:Spinal  Level of Consciousness: awake, alert , oriented and patient cooperative  Airway & Oxygen Therapy: Patient Spontanous Breathing  Post-op Assessment: Report given to RN and Post -op Vital signs reviewed and stable  Post vital signs: Reviewed and stable  Last Vitals:  Vitals:   04/16/17 2331 04/17/17 0001  BP: 138/64 127/84  Pulse: (!) 108 99  Resp:    Temp:    SpO2:      Last Pain:  Vitals:   04/16/17 2301  TempSrc: Oral  PainSc:          Complications: No apparent anesthesia complications

## 2017-04-17 NOTE — Lactation Note (Addendum)
This note was copied from a baby's chart. Lactation Consultation Note  Patient Name: Pamela Berta Minormily Chamblin WUJWJ'XToday's Date: 04/17/2017 Reason for consult: Initial assessment   Baby 18 hours old  P1.  Mother has short shaft R nipple and L nipple is flat. Parents state baby falls asleep during the feeding. Discussed STS and compressing breast to keep baby active. Reviewed hand expression and drops expressed bilaterally. Mother prepumped w/ manual pump to help evert nipples. Baby latched well on R side and on L it took a few attempts before she was able to sustain latch.  Rhythmical sucks and swallows observed. Baby latched in football position on R side and modified cross cradle on L side. Mom encouraged to feed baby 8-12 times/24 hours and with feeding cues.  Discussed basics. Suggest giving baby drops on spoon if sleepy and has not latched. Mom made aware of O/P services, breastfeeding support groups, community resources, and our phone # for post-discharge questions.      Maternal Data Has patient been taught Hand Expression?: Yes Does the patient have breastfeeding experience prior to this delivery?: No  Feeding Feeding Type: Breast Fed Length of feed: 20 min  LATCH Score Latch: Grasps breast easily, tongue down, lips flanged, rhythmical sucking.  Audible Swallowing: A few with stimulation  Type of Nipple: Everted at rest and after stimulation(short shaft R, L flat)  Comfort (Breast/Nipple): Soft / non-tender  Hold (Positioning): Assistance needed to correctly position infant at breast and maintain latch.  LATCH Score: 8  Interventions Interventions: Breast feeding basics reviewed;Assisted with latch;Skin to skin;Breast massage;Pre-pump if needed;Breast compression;Adjust position;Support pillows;Position options;Expressed milk;Hand pump  Lactation Tools Discussed/Used     Consult Status Consult Status: Follow-up Date: 04/18/17 Follow-up type:  In-patient    Pamela Martin, Pamela Martin [facility] 04/17/2017, 9:08 PM

## 2017-04-17 NOTE — Progress Notes (Signed)
Inpatient Diabetes Program Recommendations  AACE/ADA: New Consensus Statement on Inpatient Glycemic Control (2015)  Target Ranges:  Prepandial:   less than 140 mg/dL      Peak postprandial:   less than 180 mg/dL (1-2 hours)      Critically ill patients:  140 - 180 mg/dL   Lab Results  Component Value Date   GLUCAP 70 04/17/2017   HGBA1C 7.2 (H) 03/06/2009    Review of Glycemic Control  Received page from [name], [name], regarding the Glycemic Control orders. Pt questioning why she could not receive meal coverage insulin (Novolog 3 units) and RN stated protocol states Do Not give meal coverage insulin if CBG < 80 mg/dL. CBG was 70 mg/dL. This Coordinator explained to RN that our protocol is primarily used for Type 2s to prevent hypoglycemia and she could give meal coverage if pt is insistent. Pt also states she would like to calculate her insulin dose like she does at home. Pt does not want her blood sugar to spike and she plans on eating a big meal. Recommended RN to call MD and request change in order from 3 units tidwc to 0-15 units tidwc and to write in comment section that pt will be calculating her meal coverage based on home scale of 1:5 CHO coverage as requested by patient.  Will continue to follow.  Thank you. [name], RD, LDN, CDE Inpatient Diabetes Coordinator [phone]

## 2017-04-17 NOTE — Anesthesia Procedure Notes (Signed)
Spinal  Patient location during procedure: OR Start time: 04/17/2017 12:49 AM End time: 04/17/2017 12:52 AM Staffing Anesthesiologist: Leilani AbleHatchett, Kalle Bernath, MD Performed: anesthesiologist  Preanesthetic Checklist Completed: patient identified, site marked, surgical consent, pre-op evaluation, timeout performed, IV checked, risks and benefits discussed and monitors and equipment checked Spinal Block Patient position: sitting Prep: site prepped and draped and DuraPrep Patient monitoring: continuous pulse ox and blood pressure Approach: midline Location: L3-4 Injection technique: single-shot Needle Needle type: Pencan  Needle gauge: 24 G Needle length: 10 cm Needle insertion depth: 5 cm Assessment Sensory level: T4

## 2017-04-17 NOTE — Progress Notes (Signed)
CBG was 70 and per order pt should not receive coverage at this time. Pt was wondering why she could not count her carbs and calculate her insulin that way since she does this at home. Pt is afraid that she will spike.  Explained to pt that I have to call MD regarding order for insulin since this would change the order.  Per MD, call diabetes coordinator to see if this is possible.  Coordinator paged at this time.   Per diabetes coordinator, recommends order reading Novolog 0-15 units TID with meals.  Pt calculating insulin per carb counting. Will call MD back for new order.   Dr. Elon SpannerLeger called, MD seeing patients in office, unable to place new orders in at this time.  Per MD ok with verbal order.  Verbal order taken per diabetes educator recommendations. New orders placed in pt's MAR. Pt aware that order has changed.  Will continue to monitor.

## 2017-04-18 ENCOUNTER — Other Ambulatory Visit: Payer: Self-pay

## 2017-04-18 LAB — GLUCOSE, CAPILLARY
GLUCOSE-CAPILLARY: 129 mg/dL — AB (ref 65–99)
Glucose-Capillary: 164 mg/dL — ABNORMAL HIGH (ref 65–99)
Glucose-Capillary: 77 mg/dL (ref 65–99)
Glucose-Capillary: 158 mg/dL — ABNORMAL HIGH (ref 65–99)

## 2017-04-18 MED ORDER — INSULIN ASPART 100 UNIT/ML ~~LOC~~ SOLN: 0.0000 [IU] | Freq: Every day | SUBCUTANEOUS | Status: DC

## 2017-04-18 MED ORDER — INSULIN ASPART 100 UNIT/ML ~~LOC~~ SOLN
0.0000 [IU] | Freq: Three times a day (TID) | SUBCUTANEOUS | Status: DC
  Administered 2017-04-18: 2 [IU] via SUBCUTANEOUS

## 2017-04-18 MED ORDER — INSULIN ASPART 100 UNIT/ML ~~LOC~~ SOLN
2.0000 [IU] | Freq: Once | SUBCUTANEOUS | Status: AC
  Administered 2017-04-18: 2 [IU] via SUBCUTANEOUS

## 2017-04-18 MED ORDER — INSULIN GLARGINE 100 UNIT/ML ~~LOC~~ SOLN
10.0000 [IU] | Freq: Every day | SUBCUTANEOUS | Status: DC
  Filled 2017-04-18: qty 0.1

## 2017-04-18 MED ORDER — INSULIN ASPART 100 UNIT/ML ~~LOC~~ SOLN
3.0000 [IU] | Freq: Three times a day (TID) | SUBCUTANEOUS | Status: DC
  Administered 2017-04-18 (×2): 3 [IU] via SUBCUTANEOUS

## 2017-04-18 NOTE — Progress Notes (Signed)
Pt requesting to take her own insulin because the order set we have is not how she takes it at home and she has been feeling uncontrolled in the hospital. Spoke to Dr. Rana SnareLowe and he said to let her manage her insulin and just record what she is taking.

## 2017-04-18 NOTE — Lactation Note (Signed)
This note was copied from a baby's chart. Lactation Consultation Note  Patient Name: Pamela Martin RUEAV'WToday's Date: 04/18/2017 Reason for consult: Follow-up assessment;1st time breastfeeding;Primapara(RN request)   Follow up with mom of 36 hour old infant at RN request. Mom was feeding infant in the football hold using the # 20 NS. Infant came off breast just as LC entered room. Nipple was round, colostrum was noted in NS and mom denied pain with the feeding. Mom reports she has no questions/concerns at this time.    Maternal Data Formula Feeding for Exclusion: No Has patient been taught Hand Expression?: Yes Does the patient have breastfeeding experience prior to this delivery?: No  Feeding Feeding Type: Breast Fed Length of feed: 0 min  LATCH Score Latch: Repeated attempts needed to sustain latch, nipple held in mouth throughout feeding, stimulation needed to elicit sucking reflex.  Audible Swallowing: A few with stimulation  Type of Nipple: Flat  Comfort (Breast/Nipple): Soft / non-tender  Hold (Positioning): Assistance needed to correctly position infant at breast and maintain latch.  LATCH Score: 6  Interventions Interventions: Breast feeding basics reviewed;Breast massage;Support pillows;Adjust position(shield)  Lactation Tools Discussed/Used Pump Review: Setup, frequency, and cleaning   Consult Status Consult Status: Follow-up Date: 04/19/17 Follow-up type: In-patient    Silas FloodSharon S Revan Gendron 04/18/2017, 1:35 PM

## 2017-04-18 NOTE — Progress Notes (Signed)
Subjective: Postpartum Day 2: Cesarean Delivery Patient reports tolerating PO, + flatus and no problems voiding.    Objective: Vital signs in last 24 hours: Temp:  [98.1 F (36.7 C)-99 F (37.2 C)] 98.3 F (36.8 C) (11/20 0500) Pulse Rate:  [58-74] 66 (11/20 0500) Resp:  [16-18] 16 (11/20 0500) BP: (110-126)/(53-72) 122/72 (11/20 0500) SpO2:  [94 %-100 %] 100 % (11/20 0500)  Physical Exam:  General: alert, cooperative, appears stated age and no distress Lochia: appropriate Uterine Fundus: firm Incision: healing well DVT Evaluation: No evidence of DVT seen on physical exam.  Recent Labs    04/16/17 1849 04/17/17 0520  HGB 12.5 12.6  HCT 37.8 38.1    Assessment/Plan: Status post Cesarean section. Postoperative course complicated by Type 1 Diabetes mamaged by Talmage NapBalan  Anticipate DC tomorrow.  [place] Shellhammer C 04/18/2017, 9:08 AM

## 2017-04-18 NOTE — Progress Notes (Addendum)
Pt taking 6 units of Novalog for dinner per her carb ratio. She is also taking her Lantus at this time.

## 2017-04-19 ENCOUNTER — Inpatient Hospital Stay (HOSPITAL_COMMUNITY): Payer: BC Managed Care – PPO

## 2017-04-19 LAB — GLUCOSE, CAPILLARY: GLUCOSE-CAPILLARY: 104 mg/dL — AB (ref 65–99)

## 2017-04-19 MED ORDER — OXYCODONE HCL 5 MG PO TABS: 5.0000 mg | ORAL_TABLET | ORAL | 0 refills | Status: DC | PRN

## 2017-04-19 MED ORDER — IBUPROFEN 600 MG PO TABS: 600.0000 mg | ORAL_TABLET | Freq: Four times a day (QID) | ORAL | 1 refills | Status: DC | PRN

## 2017-04-19 NOTE — Discharge Summary (Signed)
Obstetric Discharge Summary Reason for Admission: onset of labor Prenatal Procedures: NST, ultrasound and DM Intrapartum Procedures: cesarean: low cervical, transverse Postpartum Procedures: none Complications-Operative and Postpartum: none Hemoglobin  Date Value Ref Range Status  04/17/2017 12.6 12.0 - 15.0 g/dL Final   HCT  Date Value Ref Range Status  04/17/2017 38.1 36.0 - 46.0 % Final    Physical Exam:  General: alert Lochia: appropriate Uterine Fundus: firm Incision: healing well DVT Evaluation: No evidence of DVT seen on physical exam.  Discharge Diagnoses: Term Pregnancy-delivered, IDDM, managed by dr Talmage NapBalan  Discharge Information: Date: 04/19/2017 Activity: pelvic rest Diet: routine Medications: PNV, Ibuprofen, Percocet and Insulin regimen per Dr Talmage NapBalan Condition: stable Instructions: refer to practice specific booklet Discharge to: home Follow-up Information    [place] Schedule an appointment as soon as possible for a visit in 1 week(s).   Contact information: [address] [place] [id number] [phone]           Newborn Data: Live born female  Birth Weight: 8 lb 14.7 oz (4045 g) APGAR: 10, 10  Newborn Delivery   Birth date/time:  04/17/2017 01:17:00 Delivery type:  C-Section, Vacuum Assisted C-section categorization:  Primary     Home with mother.  Pamela Therien [name] Pamela Martin 04/19/2017, 9:01 AM

## 2017-04-26 ENCOUNTER — Inpatient Hospital Stay (HOSPITAL_COMMUNITY)
Admission: AD | Admit: 2017-04-26 | Payer: BC Managed Care – PPO | Source: Ambulatory Visit | Admitting: Obstetrics and Gynecology

## 2018-01-25 ENCOUNTER — Ambulatory Visit: Payer: Self-pay | Admitting: Nurse Practitioner

## 2018-01-25 VITALS — BP 120/82 | HR 84 | Temp 98.0°F | Resp 16 | Wt 177.0 lb

## 2018-01-25 DIAGNOSIS — K121 Other forms of stomatitis: Secondary | ICD-10-CM

## 2018-01-25 MED ORDER — MAGIC MOUTHWASH W/LIDOCAINE: 10.0000 mL | Freq: Three times a day (TID) | ORAL | 0 refills | Status: AC

## 2018-01-25 NOTE — Progress Notes (Signed)
Subjective:    Patient ID: Pamela Martin, female    DOB: 04-18-1986, 32 y.o.   MRN: [id number]  The patient is a 32 year old female who presents today for complaints of mouth pain, and white spots in her mouth for 1 day.  The patient also complains of intermittent sore throat over the last several days.  Patient denies fever, cough, congestion, runny nose, chills, malaise.  The patient does inform that her daughter, who is 249 months old, was recently diagnosed with oral thrush.  The patient does have a history of diabetes, which is well controlled.  Her last A1c was 5.4.  The patient states she has been taking ibuprofen for her mouth pain, which she states has helped.  Mouth Lesions   The current episode started yesterday. The onset was sudden. The problem occurs frequently. The problem has been unchanged. The problem is moderate. Relieved by: IBUPROFEN. Exacerbated by: eating cold foods. Associated symptoms include mouth sores and sore throat. Pertinent negatives include no fever, no congestion, no ear pain, no rhinorrhea and no swollen glands.   Reviewed the patient's past medical history, current medications, and allergies.   Review of Systems  Constitutional: Negative.  Negative for activity change, appetite change, chills and fever.  HENT: Positive for mouth sores and sore throat. Negative for congestion, ear pain and rhinorrhea.   Eyes: Negative.   Respiratory: Negative.   Cardiovascular: Negative.   Gastrointestinal: Negative.       Objective:   Physical Exam  Constitutional: She is oriented to person, place, and time. She appears well-developed and well-nourished. She does not appear ill. No distress.  HENT:  Head: Normocephalic and atraumatic.  Right Ear: Tympanic membrane and ear canal normal.  Left Ear: Tympanic membrane and ear canal normal.  Mouth/Throat: Uvula is midline, oropharynx is clear and moist and mucous membranes are normal. Oral lesions ( 3 oral lesions to right  bucca mucosa, and swelling along the lower gumline.  No exudates, no tonsil swelling or tonsillar abscess.) present. Tonsils are 0 on the right. Tonsils are 0 on the left.  Eyes: Pupils are equal, round, and reactive to light.  Neck: Normal range of motion. Neck supple. No thyromegaly present.  Cardiovascular: Normal rate, regular rhythm and normal heart sounds.  Neurological: She is alert and oriented to person, place, and time.  Skin: Skin is warm and dry.  Psychiatric: She has a normal mood and affect. Her behavior is normal.      Assessment & Plan:  Stomatitis  Exam findings, diagnosis etiology and medication use and indications reviewed with patient. Follow- Up and discharge instructions provided. No emergent/urgent issues found on exam.  Patient was given a prescription for Magic mouthwash which includes doxycycline, Benadryl, nystatin, and prednisone.  Patient was concerned about thrush, and informed her that this medication would also cover that as it contains nystatin. Patient verbalized understanding of information provided and agrees with plan of care (POC), all questions answered. The patient is advised to call or return to clinic if he does not see an improvement in symptoms, or to seek the care of the closest emergency department if he worsens with the above plan.    1. Stomatitis  - magic mouthwash w/lidocaine SOLN; Take 10 mLs by mouth 3 (three) times daily for 10 days. Gargle and spit four times daily prn for mouth pain for 10 days.  Dispense: 300 mL; Refill: 0 -Gargle, swish and spit mouthwash solution four times daily for 10 days. -Change toothbrush  after 3 days. -Avoid spicy or hot foods until symptoms improve. -Good oral hygiene. -Follow up with PCP

## 2018-01-25 NOTE — Patient Instructions (Addendum)
Stomatitis -Gargle, swish,  and spit mouthwash solution four times daily for 10 days. -Change toothbrush after 3 days. -Avoid spicy or hot foods until symptoms improve. -Good oral hygiene. -Follow up with PCP      Stomatitis is a condition that causes inflammation in your mouth. It can affect a part of your mouth or your whole mouth. The condition often affects your cheek, teeth, gums, lips, and tongue. Stomatitis can also affect the mucous membranes that surround your mouth (mucosa). Pain from stomatitis can make it hard for you to eat or drink. Severe cases of this condition can lead to dehydration or poor nutrition. What are the causes? Common causes of this condition include:  Viruses, such as cold sores or oral herpes and shingles.  Canker sores.  Bacterial infections.  Fungus or yeast infections, such as oral thrush.  Not getting adequate nutrition.  Injury to your mouth. This can be from: ? Dentures or braces that do not fit well. ? Biting your tongue or cheek. ? Burning your mouth. ? Having sharp or broken teeth.  Gum disease.  Using tobacco, especially chewing tobacco.  Allergies to foods, medicines, or substances that are used in your mouth.  Medicines, including cancer medicines (chemotherapy), antihistamines, and seizure medicines.  In some cases, the cause may not be known. What increases the risk? This condition is more likely to develop in people who:  Have poor oral hygiene or poor nutrition.  Have any condition that causes a dry mouth.  Are under a lot of physical or emotional stress.  Have any condition that weakens the body's defense system (immune system).  Are being treated for cancer.  Smoke.  What are the signs or symptoms? The most common symptoms of this condition are pain, swelling, and redness inside your mouth. The pain may feel like burning or stinging. It may get worse from eating or drinking. Other symptoms include:  Painful,  shallow sores (ulcers) in the mouth.  Blisters in the mouth.  Bleeding gums.  Swollen gums.  Irritability and fatigue.  Bad breath.  Bad taste in the mouth.  Fever.  How is this diagnosed? This condition is diagnosed with a physical exam to check for bleeding gums and mouth ulcers. You may also have other tests, including:  Blood tests to look for infection or vitamin deficiencies.  Mouth swab to get a fluid sample to test for bacteria (culture).  Tissue sample from an ulcer to examine under a microscope (biopsy).  How is this treated? Treatment for stomatitis depends on the cause. Treatment may include medicines, such as:  Over-the counter (OTC) pain medicines.  Topical anesthetic to numb the area if you have severe pain.  Antibiotics to treat a bacterial infection.  Antifungals to treat a fungal infection.  Antivirals to treat a viral infection.  Mouth rinses that contain steroids to reduce the swelling in your mouth.  Other medicines to coat or numb your mouth.  Follow these instructions at home: Medicines  Take medicines only as directed by your health care provider.  If you were prescribed an antibiotic, finish all of it even if you start to feel better. Lifestyle  Practice good oral hygiene: ? Gently brush your teeth with a soft, nylon-bristled toothbrush two times each day. ? Floss your teeth every day. ? Have your teeth cleaned regularly, as recommended by your dentist.  Eat a balanced diet.Do not eat: ? Spicy foods. ? Citrus, such as oranges. ? Foods that have sharp edges, such  as chips.  Avoid any foods or other allergens that you think may be causing your stomatitis.  If you have dentures, make sure that they are properly fitted.  Do not use any tobacco products, including cigarettes, chewing tobacco, or electronic cigarettes. If you need help quitting, ask your health care provider.  Find ways to reduce stress. Try yoga or meditation. Ask  your health care provider for other ideas. General instructions  Use a salt-water rinse for pain as directed by your health care provider. Mix 1 tsp of salt in 2 cups of water.  Drink enough fluid to keep your urine clear or pale yellow. This will keep you hydrated. Contact a health care provider if:  Your symptoms get worse.  You develop new symptoms, especially: ? A rash. ? New symptoms that do not involve your mouth area.  Your symptoms last longer than three weeks.  Your stomatitis goes away and then returns.  You have a harder time eating and drinking normally.  You have increasing fatigue or weakness.  You lose your appetite or you feel nauseous.  You have a fever. This information is not intended to replace advice given to you by your health care provider. Make sure you discuss any questions you have with your health care provider. Document Released: 03/13/2007 Document Revised: 01/13/2016 Document Reviewed: 05/12/2014 Elsevier Interactive Patient Education  [employer].

## 2019-03-25 LAB — OB RESULTS CONSOLE ABO/RH: RH Type: POSITIVE

## 2019-03-25 LAB — OB RESULTS CONSOLE HIV ANTIBODY (ROUTINE TESTING): HIV: NONREACTIVE

## 2019-03-25 LAB — OB RESULTS CONSOLE GC/CHLAMYDIA
Chlamydia: NEGATIVE
Gonorrhea: NEGATIVE

## 2019-03-25 LAB — OB RESULTS CONSOLE HEPATITIS B SURFACE ANTIGEN: Hepatitis B Surface Ag: NEGATIVE

## 2019-03-25 LAB — OB RESULTS CONSOLE RUBELLA ANTIBODY, IGM: Rubella: IMMUNE

## 2019-03-25 LAB — OB RESULTS CONSOLE RPR: RPR: NONREACTIVE

## 2019-03-25 LAB — OB RESULTS CONSOLE ANTIBODY SCREEN: Antibody Screen: NEGATIVE

## 2019-08-19 ENCOUNTER — Other Ambulatory Visit: Payer: Self-pay

## 2019-08-19 ENCOUNTER — Inpatient Hospital Stay (HOSPITAL_COMMUNITY)
Admission: AD | Admit: 2019-08-19 | Discharge: 2019-08-19 | Disposition: A | Discharge status: Home / Self Care | Payer: BC Managed Care – PPO | Attending: Obstetrics and Gynecology | Admitting: Obstetrics and Gynecology

## 2019-08-19 ENCOUNTER — Encounter (HOSPITAL_COMMUNITY): Payer: Self-pay | Admitting: Obstetrics and Gynecology

## 2019-08-19 ENCOUNTER — Inpatient Hospital Stay (HOSPITAL_BASED_OUTPATIENT_CLINIC_OR_DEPARTMENT_OTHER): Payer: BC Managed Care – PPO

## 2019-08-19 DIAGNOSIS — O360130 Maternal care for anti-D [Rh] antibodies, third trimester, not applicable or unspecified: Secondary | ICD-10-CM | POA: Diagnosis not present

## 2019-08-19 DIAGNOSIS — Z3A29 29 weeks gestation of pregnancy: Secondary | ICD-10-CM | POA: Diagnosis not present

## 2019-08-19 DIAGNOSIS — Z3689 Encounter for other specified antenatal screening: Secondary | ICD-10-CM

## 2019-08-19 DIAGNOSIS — O24313 Unspecified pre-existing diabetes mellitus in pregnancy, third trimester: Secondary | ICD-10-CM

## 2019-08-19 DIAGNOSIS — O4693 Antepartum hemorrhage, unspecified, third trimester: Secondary | ICD-10-CM

## 2019-08-19 DIAGNOSIS — Z794 Long term (current) use of insulin: Secondary | ICD-10-CM | POA: Insufficient documentation

## 2019-08-19 DIAGNOSIS — O09523 Supervision of elderly multigravida, third trimester: Secondary | ICD-10-CM | POA: Diagnosis not present

## 2019-08-19 DIAGNOSIS — O4690 Antepartum hemorrhage, unspecified, unspecified trimester: Secondary | ICD-10-CM | POA: Diagnosis not present

## 2019-08-19 DIAGNOSIS — E109 Type 1 diabetes mellitus without complications: Secondary | ICD-10-CM | POA: Insufficient documentation

## 2019-08-19 DIAGNOSIS — Z6790 Unspecified blood type, Rh positive: Secondary | ICD-10-CM

## 2019-08-19 DIAGNOSIS — O24013 Pre-existing diabetes mellitus, type 1, in pregnancy, third trimester: Secondary | ICD-10-CM | POA: Insufficient documentation

## 2019-08-19 LAB — URINALYSIS, ROUTINE W REFLEX MICROSCOPIC
Bilirubin Urine: NEGATIVE
Glucose, UA: >=500 mg/dL — AB
Hgb urine dipstick: NEGATIVE
Ketones, ur: NEGATIVE mg/dL
Leukocytes,Ua: NEGATIVE
Nitrite: NEGATIVE
Protein, ur: NEGATIVE mg/dL
Specific Gravity, Urine: 1.002 — ABNORMAL LOW (ref 1.005–1.030)
pH: 7 (ref 5.0–8.0)

## 2019-08-19 LAB — GLUCOSE, CAPILLARY: Glucose-Capillary: 98 mg/dL (ref 70–99)

## 2019-08-19 LAB — FETAL FIBRONECTIN: Fetal Fibronectin: NEGATIVE

## 2019-08-19 LAB — WET PREP, GENITAL
Clue Cells Wet Prep HPF POC: NONE SEEN
Sperm: NONE SEEN
Trich, Wet Prep: NONE SEEN
Yeast Wet Prep HPF POC: NONE SEEN

## 2019-08-19 NOTE — MAU Provider Note (Signed)
History     CSN: [id number]  Arrival date and time: 08/19/19 1906   First Provider Initiated Contact with Patient 08/19/19 2022      Chief Complaint  Patient presents with  . Vaginal Bleeding   Ms. Pamela Martin is a 34 y.o. G2P1001 at [redacted]w[redacted]d who presents to MAU for vaginal bleeding which began around 6pm tonight. Pt reports last intercourse was a couple of months ago. Pt reports she went to use the bathroom and saw blood in the toilet and also on the paper after wiping. Pt reports it was not a lot of blood. Pt denies wearing a pad and only saw a few drops of blood in her underwear after using the restroom in MAU. Pt denies any blood on the toilet paper after using the restroom in MAU.  Passing blood clots? no Blood soaking clothes? no Lightheaded/dizzy? no Significant pelvic pain or cramping/ctx? Pt reports on the car ride to MAU she had some minor cramping, but denies cramping since being in MAU. Passed any tissue? no Recent trauma? no Prior abruption? no Smoking/drug use? no HTN? no PPROM? no Current pregnancy problems? Type I DM Hx of C/S or GYN surgery? Hx of C/S for failure to dilate  Blood Type? O Positive Allergies? NKDA Current medications? Novolog, Tresiba, multivitamin, bASA Current [facility] & next appt? [facility], 08/29/2019   OB History    Gravida  2   Para  1   Term  1   Preterm      AB      Living  1     SAB      TAB      Ectopic      Multiple  0   Live Births  1           Past Medical History:  Diagnosis Date  . Diabetes mellitus without complication (HCC)    shots  . Tachycardia     Past Surgical History:  Procedure Laterality Date  . CESAREAN SECTION N/A 04/17/2017   Procedure: CESAREAN SECTION;  Surgeon: Harold Hedge, MD;  Location: [facility] BIRTHING SUITES;  Service: Obstetrics;  Laterality: N/A;  . WISDOM TOOTH EXTRACTION      Family History  Problem Relation Age of Onset  . Heart disease Father   . Alcohol abuse  Brother   . Breast cancer Maternal Grandmother   . Heart disease Paternal Grandfather   . Breast cancer Paternal Aunt     Social History   Tobacco Use  . Smoking status: Never Smoker  . Smokeless tobacco: Never Used  Substance Use Topics  . Alcohol use: No    Alcohol/week: 3.0 standard drinks    Types: 1 Cans of beer, 2 Standard drinks or equivalent per week  . Drug use: No    Allergies: No Known Allergies  Medications Prior to Admission  Medication Sig Dispense Refill Last Dose  . aspirin 81 MG chewable tablet Chew by mouth daily.   08/19/2019 at Unknown time  . insulin aspart (NOVOLOG) 100 UNIT/ML injection Inject 8-12 Units into the skin 3 (three) times daily before meals.    08/19/2019 at Unknown time  . Prenatal Vit-Fe Fumarate-FA (PRENATAL MULTIVITAMIN) TABS tablet Take 1 tablet daily at 12 noon by mouth.   08/19/2019 at Unknown time  . TRESIBA FLEXTOUCH 100 UNIT/ML SOPN FlexTouch Pen Inject 14 Units daily at 10 pm into the skin.    08/19/2019 at Unknown time  . ibuprofen (ADVIL,MOTRIN) 600 MG tablet Take 1  tablet (600 mg total) by mouth every 6 (six) hours as needed. 30 tablet 1   . oxyCODONE (OXY IR/ROXICODONE) 5 MG immediate release tablet Take 1 tablet (5 mg total) by mouth every 4 (four) hours as needed for severe pain (pain scale 4-7). (Patient not taking: Reported on 01/25/2018) 20 tablet 0     Review of Systems  Constitutional: Negative for chills, diaphoresis, fatigue and fever.  Eyes: Negative for visual disturbance.  Respiratory: Negative for shortness of breath.   Cardiovascular: Negative for chest pain.  Gastrointestinal: Negative for abdominal pain, constipation, diarrhea, nausea and vomiting.  Genitourinary: Positive for vaginal bleeding. Negative for dysuria, flank pain, frequency, pelvic pain, urgency and vaginal discharge.  Neurological: Negative for dizziness, weakness, light-headedness and headaches.   Physical Exam   Blood pressure 122/66, pulse (!) 108,  temperature 98.8 F (37.1 C), temperature source Oral, resp. rate 16, height 5\' 6"  (1.676 m), weight 91.2 kg, SpO2 100 %, unknown if currently breastfeeding.  Patient Vitals for the past 24 hrs:  BP Temp Temp src Pulse Resp SpO2 Height Weight  08/19/19 1947 122/66 98.8 F (37.1 C) Oral (!) 108 16 100 % -- --  08/19/19 1944 -- -- -- -- -- -- 5\' 6"  (1.676 m) 91.2 kg    Physical Exam  Constitutional: She is oriented to person, place, and time. She appears well-developed and well-nourished. No distress.  HENT:  Head: Normocephalic and atraumatic.  Respiratory: Effort normal.  GI: Soft. She exhibits no distension and no mass. There is no abdominal tenderness. There is no rebound and no guarding.  Genitourinary: There is no rash, tenderness or lesion on the right labia. There is no rash, tenderness or lesion on the left labia. Uterus is not enlarged and not tender. Cervix exhibits no motion tenderness, no discharge and no friability.    Vaginal discharge (small, white, appropriate consistency) present.     No vaginal tenderness or bleeding.  No tenderness or bleeding in the vagina.    Genitourinary Comments: No active bleeding noted on exam.   Neurological: She is alert and oriented to person, place, and time.  Skin: Skin is warm and dry. She is not diaphoretic.  Psychiatric: She has a normal mood and affect. Her behavior is normal. Judgment and thought content normal.   Results for orders placed or performed during the hospital encounter of 08/19/19 (from the past 24 hour(s))  Urinalysis, Routine w reflex microscopic     Status: Abnormal   Collection Time: 08/19/19  8:41 PM  Result Value Ref Range   Color, Urine STRAW (A) YELLOW   APPearance HAZY (A) CLEAR   Specific Gravity, Urine 1.002 (L) 1.005 - 1.030   pH 7.0 5.0 - 8.0   Glucose, UA >=500 (A) NEGATIVE mg/dL   Hgb urine dipstick NEGATIVE NEGATIVE   Bilirubin Urine NEGATIVE NEGATIVE   Ketones, ur NEGATIVE NEGATIVE mg/dL   Protein,  ur NEGATIVE NEGATIVE mg/dL   Nitrite NEGATIVE NEGATIVE   Leukocytes,Ua NEGATIVE NEGATIVE   RBC / HPF 0-5 0 - 5 RBC/hpf   WBC, UA 0-5 0 - 5 WBC/hpf   Bacteria, UA FEW (A) NONE SEEN   Squamous Epithelial / LPF 11-20 0 - 5   Mucus PRESENT   Fetal fibronectin     Status: None   Collection Time: 08/19/19  8:41 PM  Result Value Ref Range   Fetal Fibronectin NEGATIVE NEGATIVE  Wet prep, genital     Status: Abnormal   Collection Time: 08/19/19  8:41  PM   Specimen: Vaginal  Result Value Ref Range   Yeast Wet Prep HPF POC NONE SEEN NONE SEEN   Trich, Wet Prep NONE SEEN NONE SEEN   Clue Cells Wet Prep HPF POC NONE SEEN NONE SEEN   WBC, Wet Prep HPF POC MODERATE (A) NONE SEEN   Sperm NONE SEEN   Glucose, capillary     Status: None   Collection Time: 08/19/19  8:51 PM  Result Value Ref Range   Glucose-Capillary 98 70 - 99 mg/dL    MAU Course  Procedures  MDM -VB in third trimester -RH positive (O+) -UA: straw/hazy/SG 1.002/GLU>/=500/few bacteria, sending urine for culture -CBG: 98 -fFN: negative -WetPrep: WNL -GC/CT collected -EFM: reactive       -baseline: 140       -variability: moderate       -accels: present, 15x15       -decels: absent       -TOCO: no ctx -[place]: placenta left anterior, no abruption or previa identified, AFI ~18cm, CL 3.9cm -on speculum exam, small amount of white discharge present, no bleeding noted on exam -CE: long/closed/posterior -consulted with Dr. Debroah Loop @948PM . Per Dr. , pt OK to be discharged home with precautions -pt discharged to home in stable condition   Orders Placed This Encounter  Procedures  . Wet prep, genital    Standing Status:   Standing    Number of Occurrences:   1  . Culture, OB Urine    Standing Status:   Standing    Number of Occurrences:   1  . Debroah Loop MFM OB LIMITED    Standing Status:   Standing    Number of Occurrences:   1    Order Specific Question:   Symptom/Reason for Exam    Answer:   Vaginal bleeding in  pregnancy [705036]  . Urinalysis, Routine w reflex microscopic    Standing Status:   Standing    Number of Occurrences:   1  . Fetal fibronectin    Standing Status:   Standing    Number of Occurrences:   1  . Glucose, capillary    Standing Status:   Standing    Number of Occurrences:   1  . Discharge patient    Order Specific Question:   Discharge disposition    Answer:   01-Home or Self Care [1]    Order Specific Question:   Discharge patient date    Answer:   08/19/2019   No orders of the defined types were placed in this encounter.   Assessment and Plan   1. Vaginal bleeding in pregnancy   2. Blood type, Rh positive   3. NST (non-stress test) reactive   4. [redacted] weeks gestation of pregnancy    Allergies as of 08/19/2019   No Known Allergies     Medication List    STOP taking these medications   ibuprofen 600 MG tablet Commonly known as: ADVIL     TAKE these medications   aspirin 81 MG chewable tablet Chew by mouth daily.   insulin aspart 100 UNIT/ML injection Commonly known as: novoLOG Inject 8-12 Units into the skin 3 (three) times daily before meals.   oxyCODONE 5 MG immediate release tablet Commonly known as: Oxy IR/ROXICODONE Take 1 tablet (5 mg total) by mouth every 4 (four) hours as needed for severe pain (pain scale 4-7).   prenatal multivitamin Tabs tablet Take 1 tablet daily at 12 noon by mouth.   08/21/2019 FlexTouch 100  UNIT/ML FlexTouch Pen Generic drug: insulin degludec Inject 14 Units daily at 10 pm into the skin.      -will call with culture results, if positive -discussed s/sx of PTL -strict bleeding/pain/return MAU precautions given -pt discharged to home in stable condition  Pamela Martin 08/19/2019, 9:58 PM

## 2019-08-19 NOTE — MAU Note (Signed)
Pt states she started having vaginal bleeding around 1800. States she saw some in the toilet and some on the toilet paper when she wiped. Reports minor cramping that comes and goes rating it a 2/10. Reports good fetal movement. Type 1 DM. Denies complications with pregnancy. Denies LOF.

## 2019-08-19 NOTE — Discharge Instructions (Signed)
Preterm Labor and Birth Information ° °The normal length of a pregnancy is 39-41 weeks. Preterm labor is when labor starts before 37 completed weeks of pregnancy. °What are the risk factors for preterm labor? °Preterm labor is more likely to occur in women who: °· Have certain infections during pregnancy such as a bladder infection, sexually transmitted infection, or infection inside the uterus (chorioamnionitis). °· Have a shorter-than-normal cervix. °· Have gone into preterm labor before. °· Have had surgery on their cervix. °· Are younger than age 17 or older than age 35. °· Are African American. °· Are pregnant with twins or multiple babies (multiple gestation). °· Take street drugs or smoke while pregnant. °· Do not gain enough weight while pregnant. °· Became pregnant shortly after having been pregnant. °What are the symptoms of preterm labor? °Symptoms of preterm labor include: °· Cramps similar to those that can happen during a menstrual period. The cramps may happen with diarrhea. °· Pain in the abdomen or lower back. °· Regular uterine contractions that may feel like tightening of the abdomen. °· A feeling of increased pressure in the pelvis. °· Increased watery or bloody mucus discharge from the vagina. °· Water breaking (ruptured amniotic sac). °Why is it important to recognize signs of preterm labor? °It is important to recognize signs of preterm labor because babies who are born prematurely may not be fully developed. This can put them at an increased risk for: °· Long-term (chronic) heart and lung problems. °· Difficulty immediately after birth with regulating body systems, including blood sugar, body temperature, heart rate, and breathing rate. °· Bleeding in the brain. °· Cerebral palsy. °· Learning difficulties. °· Death. °These risks are highest for babies who are born before 34 weeks of pregnancy. °How is preterm labor treated? °Treatment depends on the length of your pregnancy, your condition,  and the health of your baby. It may involve: °· Having a stitch (suture) placed in your cervix to prevent your cervix from opening too early (cerclage). °· Taking or being given medicines, such as: °? Hormone medicines. These may be given early in pregnancy to help support the pregnancy. °? Medicine to stop contractions. °? Medicines to help mature the baby’s lungs. These may be prescribed if the risk of delivery is high. °? Medicines to prevent your baby from developing cerebral palsy. °If the labor happens before 34 weeks of pregnancy, you may need to stay in the hospital. °What should I do if I think I am in preterm labor? °If you think that you are going into preterm labor, call your health care provider right away. °How can I prevent preterm labor in future pregnancies? °To increase your chance of having a full-term pregnancy: °· Do not use any tobacco products, such as cigarettes, chewing tobacco, and e-cigarettes. If you need help quitting, ask your health care provider. °· Do not use street drugs or medicines that have not been prescribed to you during your pregnancy. °· Talk with your health care provider before taking any herbal supplements, even if you have been taking them regularly. °· Make sure you gain a healthy amount of weight during your pregnancy. °· Watch for infection. If you think that you might have an infection, get it checked right away. °· Make sure to tell your health care provider if you have gone into preterm labor before. °This information is not intended to replace advice given to you by your health care provider. Make sure you discuss any questions you have with your   health care provider. Document Revised: 09/07/2018 Document Reviewed: 10/07/2015 Elsevier Patient Education  2020 [employer] Vaginal Bleeding During Pregnancy, Third Trimester  A small amount of bleeding from the vagina (spotting) is relatively common during pregnancy. Various things can cause bleeding or  spotting during pregnancy. Sometimes bleeding is normal and is not a problem. However, bleeding during the third trimester can also be a sign of something serious for the mother and the baby. Be sure to tell your health care provider about any vaginal bleeding right away. Some possible causes of vaginal bleeding during the third trimester include:  Infection or growths (polyps) on the cervix.  A condition in which the placenta partially or completely covers the opening of the cervix inside the uterus (placenta previa).  The placenta separating from the uterus (placenta abruption).  The start of labor (discharging of the mucus plug).  A condition in which the placenta grows into the muscle layer of the uterus (placenta accreta). Follow these instructions at home: Activity  Follow instructions from your health care provider about limiting your activity. If your health care provider recommends activity restriction, you may need to stay in bed and only get up to use the bathroom. In some cases, your health care provider may allow you to continue light activity.  If needed, make plans for someone to help with your regular activities.  Ask your health care provider if it is safe for you to drive.  Do not lift anything that is heavier than 10 lb (4.5 kg), or the limit that your health care provider tells you, until he or she says that it is safe.  Do not have sex or orgasms until your health care provider says that this is safe. Medicines  Take over-the-counter and prescription medicines only as told by your health care provider.  Do not take aspirin because it can cause bleeding. General instructions  Pay attention to any changes in your symptoms.  Write down how many pads you use each day, how often you change pads, and how soaked (saturated) they are.  Do not use tampons or douche.  If you pass any tissue from your vagina, save the tissue so you can show it to your health care  provider.  Keep all follow-up visits as told by your health care provider. This is important. Contact a health care provider if:  You have vaginal bleeding during any part of your pregnancy.  You have cramps or labor pains.  You have a fever. Get help right away if:  You have severe cramps or pain in your back or abdomen.  You have a gush of fluid from the vagina.  You pass large clots or a large amount of tissue from your vagina.  Your bleeding increases.  You feel light-headed or weak.  You faint.  You feel that your baby is moving less than usual, or not moving at all. Summary  Various things can cause bleeding or spotting in pregnancy.  Bleeding during the third trimester can be a sign of a serious problem for the mother and the baby.  Be sure to tell your health care provider about any vaginal bleeding right away. This information is not intended to replace advice given to you by your health care provider. Make sure you discuss any questions you have with your health care provider. Document Revised: 09/04/2018 Document Reviewed: 08/18/2016 Elsevier Patient Education  2020 [employer].

## 2019-08-20 LAB — CULTURE, OB URINE

## 2019-08-21 LAB — GC/CHLAMYDIA PROBE AMP (~~LOC~~) NOT AT ARMC
Chlamydia: NEGATIVE
Comment: NEGATIVE
Comment: NORMAL
Neisseria Gonorrhea: NEGATIVE

## 2019-10-10 ENCOUNTER — Encounter (HOSPITAL_COMMUNITY): Payer: Self-pay

## 2019-10-10 NOTE — Patient Instructions (Addendum)
Pamela Martin  10/10/2019   Your procedure is scheduled on:  10/24/2019  Arrive at 0530 at Entrance C on [address] at [facility]  and [employer]. You are invited to use the FREE valet parking or use the Visitor's parking deck.  Pick up the phone at the desk and dial [phone].  Call this number if you have problems the morning of surgery: [phone]  Remember:   Do not eat food:(After Midnight) Desps de medianoche.  Do not drink clear liquids: (After Midnight) Desps de medianoche.  Take these medicines the morning of surgery with A SIP OF WATER:  14u triseba insulin the night before surgery.  No meds the day of surgery   Do not wear jewelry, make-up or nail polish.  Do not wear lotions, powders, or perfumes. Do not wear deodorant.  Do not shave 48 hours prior to surgery.  Do not bring valuables to the hospital.  [facility] is not   responsible for any belongings or valuables brought to the hospital.  Contacts, dentures or bridgework may not be worn into surgery.  Leave suitcase in the car. After surgery it may be brought to your room.  For patients admitted to the hospital, checkout time is 11:00 AM the day of              discharge.      Please read over the following fact sheets that you were given:     Preparing for Surgery

## 2019-10-21 NOTE — H&P (Signed)
Pamela Martin is a 33 y.o. female G2P1001 at 9 weeks presenting for repeat C/S; declines TOLAC.  Patient has T1DM and has been well controlled on insulin; managed by Dr. Talmage Nap.  Serial u/s and twice weekly testing has been reassuring and patient has taken daily low dose ASA.  Fetal ECHO was normal.  Patient is AMA and had low risk NIPS.  GBS negative.  OB History    Gravida  2   Para  1   Term  1   Preterm      AB      Living  1     SAB      TAB      Ectopic      Multiple  0   Live Births  1          Past Medical History:  Diagnosis Date  . Diabetes mellitus without complication (HCC)    shots  . Tachycardia    Past Surgical History:  Procedure Laterality Date  . CESAREAN SECTION N/A 04/17/2017   Procedure: CESAREAN SECTION;  Surgeon: Harold Hedge, MD;  Location: [facility];  Service: Obstetrics;  Laterality: N/A;  . WISDOM TOOTH EXTRACTION     Family History: family history includes Alcohol abuse in her brother; Breast cancer in her maternal grandmother and paternal aunt; Heart disease in her father and paternal grandfather. Social History:  reports that she has never smoked. She has never used smokeless tobacco. She reports that she does not drink alcohol or use drugs.     Maternal Diabetes: Yes:  Diabetes Type:  Pre-pregnancy, Insulin/Medication controlled Genetic Screening: Normal Maternal Ultrasounds/Referrals: Normal Fetal Ultrasounds or other Referrals:  Fetal echo Maternal Substance Abuse:  No Significant Maternal Medications:  Meds include: Other: Insulin Significant Maternal Lab Results:  Group B Strep negative Other Comments:  None  Review of Systems Maternal Medical History:  Prenatal complications: no prenatal complications Prenatal Complications - Diabetes: type 1. Diabetes is managed by insulin injections.        unknown if currently breastfeeding. Maternal Exam:  Abdomen: Surgical scars: low transverse.   Fundal height is c/w  dates.   Estimated fetal weight is 8#.       Physical Exam  Constitutional: She is oriented to person, place, and time. She appears well-developed and well-nourished.  Respiratory: Effort normal.  GI: Soft. There is no rebound and no guarding.  Musculoskeletal:     Cervical back: Normal range of motion.  Neurological: She is alert and oriented to person, place, and time.  Skin: Skin is warm and dry.  Psychiatric: She has a normal mood and affect. Her behavior is normal.    Prenatal labs: ABO, Rh: O/Positive/-- (10/26 0000) Antibody: Negative (10/26 0000) Rubella: Immune (10/26 0000) RPR: Nonreactive (10/26 0000)  HBsAg: Negative (10/26 0000)  HIV: Non-reactive (10/26 0000)  GBS:   Negative  Assessment/Plan: 33yo G2P1001 at 39 weeks for repeat C/S Patient is counseled re: risk of bleeding, infection, scarring, and damage to surrounding structures.  Patient understands implications in future pregnancies specifically, abnormal placentation and increased risk for uterine rupture with each successive C/S.  All questions were answered and the patient wishes to proceed.   Mitchel Honour 10/21/2019, 9:10 AM

## 2019-10-22 ENCOUNTER — Other Ambulatory Visit (HOSPITAL_COMMUNITY)
Admission: RE | Admit: 2019-10-22 | Discharge: 2019-10-22 | Disposition: A | Discharge status: Home / Self Care | Payer: BC Managed Care – PPO | Source: Ambulatory Visit | Attending: Obstetrics & Gynecology | Admitting: Obstetrics & Gynecology

## 2019-10-22 ENCOUNTER — Other Ambulatory Visit: Payer: Self-pay

## 2019-10-22 ENCOUNTER — Inpatient Hospital Stay (HOSPITAL_COMMUNITY)
Admission: AD | Admit: 2019-10-22 | Payer: BC Managed Care – PPO | Source: Home / Self Care | Admitting: Obstetrics & Gynecology

## 2019-10-22 DIAGNOSIS — Z20822 Contact with and (suspected) exposure to covid-19: Secondary | ICD-10-CM | POA: Insufficient documentation

## 2019-10-22 LAB — CBC
HCT: 33.4 % — ABNORMAL LOW (ref 36.0–46.0)
Hemoglobin: 10.5 g/dL — ABNORMAL LOW (ref 12.0–15.0)
MCH: 26.9 pg (ref 26.0–34.0)
MCHC: 31.4 g/dL (ref 30.0–36.0)
MCV: 85.6 fL (ref 80.0–100.0)
Platelets: 180 10*3/uL (ref 150–400)
RBC: 3.9 MIL/uL (ref 3.87–5.11)
RDW: 12.7 % (ref 11.5–15.5)
WBC: 7.3 10*3/uL (ref 4.0–10.5)
nRBC: 0 % (ref 0.0–0.2)

## 2019-10-22 LAB — ABO/RH: ABO/RH(D): O POS

## 2019-10-22 LAB — TYPE AND SCREEN
ABO/RH(D): O POS
Antibody Screen: NEGATIVE

## 2019-10-22 LAB — RPR: RPR Ser Ql: NONREACTIVE

## 2019-10-22 LAB — SARS CORONAVIRUS 2 (TAT 6-24 HRS): SARS Coronavirus 2: NEGATIVE

## 2019-10-22 NOTE — MAU Note (Signed)
Asymptomatic, swab collected.  Lab collected.

## 2019-10-24 ENCOUNTER — Other Ambulatory Visit: Payer: Self-pay

## 2019-10-24 ENCOUNTER — Encounter (HOSPITAL_COMMUNITY): Payer: Self-pay | Admitting: Obstetrics & Gynecology

## 2019-10-24 ENCOUNTER — Inpatient Hospital Stay (HOSPITAL_COMMUNITY): Payer: BC Managed Care – PPO | Admitting: Anesthesiology

## 2019-10-24 ENCOUNTER — Encounter (HOSPITAL_COMMUNITY)
Admission: RE | Disposition: A | Discharge status: Home / Self Care | Payer: Self-pay | Source: Home / Self Care | Attending: Obstetrics & Gynecology

## 2019-10-24 ENCOUNTER — Inpatient Hospital Stay (HOSPITAL_COMMUNITY)
Admission: RE | Admit: 2019-10-24 | Discharge: 2019-10-26 | DRG: 786 | Disposition: A | Discharge status: Home / Self Care | Payer: BC Managed Care – PPO | Attending: Obstetrics & Gynecology | Admitting: Obstetrics & Gynecology

## 2019-10-24 DIAGNOSIS — Z3A39 39 weeks gestation of pregnancy: Secondary | ICD-10-CM

## 2019-10-24 DIAGNOSIS — E109 Type 1 diabetes mellitus without complications: Secondary | ICD-10-CM | POA: Diagnosis present

## 2019-10-24 DIAGNOSIS — Z7982 Long term (current) use of aspirin: Secondary | ICD-10-CM | POA: Diagnosis not present

## 2019-10-24 DIAGNOSIS — O34211 Maternal care for low transverse scar from previous cesarean delivery: Principal | ICD-10-CM | POA: Diagnosis present

## 2019-10-24 DIAGNOSIS — Z20822 Contact with and (suspected) exposure to covid-19: Secondary | ICD-10-CM | POA: Diagnosis present

## 2019-10-24 DIAGNOSIS — Z794 Long term (current) use of insulin: Secondary | ICD-10-CM

## 2019-10-24 DIAGNOSIS — Z98891 History of uterine scar from previous surgery: Secondary | ICD-10-CM

## 2019-10-24 DIAGNOSIS — O2402 Pre-existing diabetes mellitus, type 1, in childbirth: Secondary | ICD-10-CM | POA: Diagnosis present

## 2019-10-24 LAB — GLUCOSE, CAPILLARY
Glucose-Capillary: 107 mg/dL — ABNORMAL HIGH (ref 70–99)
Glucose-Capillary: 109 mg/dL — ABNORMAL HIGH (ref 70–99)

## 2019-10-24 SURGERY — Surgical Case
Anesthesia: Spinal

## 2019-10-24 MED ORDER — WITCH HAZEL-GLYCERIN EX PADS: 1.0000 "application " | MEDICATED_PAD | CUTANEOUS | Status: DC | PRN

## 2019-10-24 MED ORDER — DIPHENHYDRAMINE HCL 50 MG/ML IJ SOLN: 12.5000 mg | INTRAMUSCULAR | Status: DC | PRN

## 2019-10-24 MED ORDER — MEPERIDINE HCL 25 MG/ML IJ SOLN
6.2500 mg | INTRAMUSCULAR | Status: DC | PRN
Start: 2019-10-24 — End: 2019-10-24

## 2019-10-24 MED ORDER — BUPIVACAINE IN DEXTROSE 0.75-8.25 % IT SOLN
INTRATHECAL | Status: DC | PRN
  Administered 2019-10-24: 1.6 mL via INTRATHECAL

## 2019-10-24 MED ORDER — NALBUPHINE HCL 10 MG/ML IJ SOLN: 5.0000 mg | INTRAMUSCULAR | Status: DC | PRN

## 2019-10-24 MED ORDER — FENTANYL CITRATE (PF) 100 MCG/2ML IJ SOLN
INTRAMUSCULAR | Status: AC
  Filled 2019-10-24: qty 2

## 2019-10-24 MED ORDER — PRENATAL MULTIVITAMIN CH
1.0000 | ORAL_TABLET | Freq: Every day | ORAL | Status: DC
  Administered 2019-10-25: 1 via ORAL
  Filled 2019-10-24: qty 1

## 2019-10-24 MED ORDER — INSULIN ASPART 100 UNIT/ML ~~LOC~~ SOLN: 0.0000 [IU] | Freq: Three times a day (TID) | SUBCUTANEOUS | Status: DC

## 2019-10-24 MED ORDER — SIMETHICONE 80 MG PO CHEW
80.0000 mg | CHEWABLE_TABLET | ORAL | Status: DC | PRN
  Administered 2019-10-24: 80 mg via ORAL

## 2019-10-24 MED ORDER — FENTANYL CITRATE (PF) 100 MCG/2ML IJ SOLN
25.0000 ug | INTRAMUSCULAR | Status: DC | PRN
Start: 2019-10-24 — End: 2019-10-24
  Administered 2019-10-24: 50 ug via INTRAVENOUS

## 2019-10-24 MED ORDER — LACTATED RINGERS IV SOLN: INTRAVENOUS | Status: DC | PRN

## 2019-10-24 MED ORDER — OXYCODONE HCL 5 MG PO TABS
5.0000 mg | ORAL_TABLET | Freq: Once | ORAL | Status: DC | PRN
Start: 2019-10-24 — End: 2019-10-24

## 2019-10-24 MED ORDER — ONDANSETRON HCL 4 MG/2ML IJ SOLN
INTRAMUSCULAR | Status: AC
  Filled 2019-10-24: qty 2

## 2019-10-24 MED ORDER — PHENYLEPHRINE HCL-NACL 20-0.9 MG/250ML-% IV SOLN
INTRAVENOUS | Status: DC | PRN
  Administered 2019-10-24: 60 ug/min via INTRAVENOUS

## 2019-10-24 MED ORDER — MORPHINE SULFATE (PF) 0.5 MG/ML IJ SOLN
INTRAMUSCULAR | Status: DC | PRN
  Administered 2019-10-24: .15 mg via INTRATHECAL

## 2019-10-24 MED ORDER — NALBUPHINE HCL 10 MG/ML IJ SOLN: 5.0000 mg | Freq: Once | INTRAMUSCULAR | Status: DC | PRN

## 2019-10-24 MED ORDER — SIMETHICONE 80 MG PO CHEW
80.0000 mg | CHEWABLE_TABLET | Freq: Three times a day (TID) | ORAL | Status: DC
  Administered 2019-10-25 (×3): 80 mg via ORAL
  Filled 2019-10-24 (×4): qty 1

## 2019-10-24 MED ORDER — FENTANYL CITRATE (PF) 100 MCG/2ML IJ SOLN
INTRAMUSCULAR | Status: DC | PRN
  Administered 2019-10-24: 15 ug via INTRATHECAL

## 2019-10-24 MED ORDER — DIPHENHYDRAMINE HCL 25 MG PO CAPS: 25.0000 mg | ORAL_CAPSULE | ORAL | Status: DC | PRN

## 2019-10-24 MED ORDER — LACTATED RINGERS IV SOLN: INTRAVENOUS | Status: DC

## 2019-10-24 MED ORDER — OXYCODONE HCL 5 MG/5ML PO SOLN: 5.0000 mg | Freq: Once | ORAL | Status: DC | PRN

## 2019-10-24 MED ORDER — ACETAMINOPHEN 325 MG PO TABS
650.0000 mg | ORAL_TABLET | ORAL | Status: DC | PRN
  Administered 2019-10-25: 650 mg via ORAL
  Filled 2019-10-24: qty 2

## 2019-10-24 MED ORDER — INSULIN GLARGINE 100 UNIT/ML ~~LOC~~ SOLN
14.0000 [IU] | Freq: Every day | SUBCUTANEOUS | Status: DC
  Filled 2019-10-24 (×3): qty 0.14

## 2019-10-24 MED ORDER — ZOLPIDEM TARTRATE 5 MG PO TABS: 5.0000 mg | ORAL_TABLET | Freq: Every evening | ORAL | Status: DC | PRN

## 2019-10-24 MED ORDER — OXYTOCIN 40 UNITS IN NORMAL SALINE INFUSION - SIMPLE MED
INTRAVENOUS | Status: AC
  Filled 2019-10-24: qty 1000

## 2019-10-24 MED ORDER — CEFAZOLIN SODIUM-DEXTROSE 2-4 GM/100ML-% IV SOLN
2.0000 g | INTRAVENOUS | Status: AC
  Administered 2019-10-24: 2 g via INTRAVENOUS

## 2019-10-24 MED ORDER — MENTHOL 3 MG MT LOZG: 1.0000 | LOZENGE | OROMUCOSAL | Status: DC | PRN

## 2019-10-24 MED ORDER — ONDANSETRON HCL 4 MG/2ML IJ SOLN: 4.0000 mg | Freq: Once | INTRAMUSCULAR | Status: DC | PRN

## 2019-10-24 MED ORDER — OXYCODONE-ACETAMINOPHEN 5-325 MG PO TABS
1.0000 | ORAL_TABLET | ORAL | Status: DC | PRN
  Administered 2019-10-25: 2 via ORAL
  Filled 2019-10-24: qty 2

## 2019-10-24 MED ORDER — MORPHINE SULFATE (PF) 0.5 MG/ML IJ SOLN
INTRAMUSCULAR | Status: AC
  Filled 2019-10-24: qty 10

## 2019-10-24 MED ORDER — KETOROLAC TROMETHAMINE 30 MG/ML IJ SOLN
30.0000 mg | Freq: Once | INTRAMUSCULAR | Status: AC | PRN
  Administered 2019-10-24: 30 mg via INTRAVENOUS

## 2019-10-24 MED ORDER — SENNOSIDES-DOCUSATE SODIUM 8.6-50 MG PO TABS
2.0000 | ORAL_TABLET | ORAL | Status: DC
  Administered 2019-10-25 (×2): 2 via ORAL
  Filled 2019-10-24 (×2): qty 2

## 2019-10-24 MED ORDER — IBUPROFEN 800 MG PO TABS
800.0000 mg | ORAL_TABLET | Freq: Four times a day (QID) | ORAL | Status: DC
  Administered 2019-10-24 – 2019-10-26 (×6): 800 mg via ORAL
  Filled 2019-10-24 (×6): qty 1

## 2019-10-24 MED ORDER — SIMETHICONE 80 MG PO CHEW
80.0000 mg | CHEWABLE_TABLET | ORAL | Status: DC
  Administered 2019-10-25 (×2): 80 mg via ORAL
  Filled 2019-10-24 (×2): qty 1

## 2019-10-24 MED ORDER — SCOPOLAMINE 1 MG/3DAYS TD PT72
1.0000 | MEDICATED_PATCH | Freq: Once | TRANSDERMAL | Status: DC
  Administered 2019-10-24: 1.5 mg via TRANSDERMAL

## 2019-10-24 MED ORDER — SCOPOLAMINE 1 MG/3DAYS TD PT72
MEDICATED_PATCH | TRANSDERMAL | Status: AC
  Filled 2019-10-24: qty 1

## 2019-10-24 MED ORDER — ONDANSETRON HCL 4 MG/2ML IJ SOLN
4.0000 mg | Freq: Three times a day (TID) | INTRAMUSCULAR | Status: DC | PRN
  Administered 2019-10-24: 4 mg via INTRAVENOUS
  Filled 2019-10-24: qty 2

## 2019-10-24 MED ORDER — SODIUM CHLORIDE 0.9% FLUSH: 3.0000 mL | INTRAVENOUS | Status: DC | PRN

## 2019-10-24 MED ORDER — SODIUM CHLORIDE 0.9 % IV SOLN
INTRAVENOUS | Status: DC | PRN
Start: 2019-10-24 — End: 2019-10-24

## 2019-10-24 MED ORDER — DIBUCAINE (PERIANAL) 1 % EX OINT: 1.0000 "application " | TOPICAL_OINTMENT | CUTANEOUS | Status: DC | PRN

## 2019-10-24 MED ORDER — DIPHENHYDRAMINE HCL 25 MG PO CAPS: 25.0000 mg | ORAL_CAPSULE | Freq: Four times a day (QID) | ORAL | Status: DC | PRN

## 2019-10-24 MED ORDER — INSULIN ASPART 100 UNIT/ML ~~LOC~~ SOLN: 4.0000 [IU] | Freq: Three times a day (TID) | SUBCUTANEOUS | Status: DC

## 2019-10-24 MED ORDER — OXYTOCIN 40 UNITS IN NORMAL SALINE INFUSION - SIMPLE MED: 2.5000 [IU]/h | INTRAVENOUS | Status: AC

## 2019-10-24 MED ORDER — OXYTOCIN 40 UNITS IN NORMAL SALINE INFUSION - SIMPLE MED
INTRAVENOUS | Status: DC | PRN
  Administered 2019-10-24: 40 [IU] via INTRAVENOUS

## 2019-10-24 MED ORDER — SODIUM CHLORIDE 0.9 % IR SOLN
Status: DC | PRN
  Administered 2019-10-24: 1

## 2019-10-24 MED ORDER — NALOXONE HCL 4 MG/10ML IJ SOLN
1.0000 ug/kg/h | INTRAVENOUS | Status: DC | PRN
  Filled 2019-10-24: qty 5

## 2019-10-24 MED ORDER — NALOXONE HCL 0.4 MG/ML IJ SOLN: 0.4000 mg | INTRAMUSCULAR | Status: DC | PRN

## 2019-10-24 MED ORDER — PHENYLEPHRINE HCL-NACL 20-0.9 MG/250ML-% IV SOLN
INTRAVENOUS | Status: AC
  Filled 2019-10-24: qty 250

## 2019-10-24 MED ORDER — KETOROLAC TROMETHAMINE 30 MG/ML IJ SOLN
INTRAMUSCULAR | Status: AC
  Filled 2019-10-24: qty 1

## 2019-10-24 MED ORDER — TETANUS-DIPHTH-ACELL PERTUSSIS 5-2.5-18.5 LF-MCG/0.5 IM SUSP: 0.5000 mL | Freq: Once | INTRAMUSCULAR | Status: DC

## 2019-10-24 MED ORDER — ONDANSETRON HCL 4 MG/2ML IJ SOLN
INTRAMUSCULAR | Status: DC | PRN
  Administered 2019-10-24: 4 mg via INTRAVENOUS

## 2019-10-24 MED ORDER — COCONUT OIL OIL: 1.0000 "application " | TOPICAL_OIL | Status: DC | PRN

## 2019-10-24 SURGICAL SUPPLY — 37 items
BENZOIN TINCTURE PRP APPL 2/3 (GAUZE/BANDAGES/DRESSINGS) ×3 IMPLANT
CHLORAPREP W/TINT 26ML (MISCELLANEOUS) ×3 IMPLANT
CLAMP CORD UMBIL (MISCELLANEOUS) IMPLANT
CLOSURE WOUND 1/2 X4 (GAUZE/BANDAGES/DRESSINGS) ×1
CLOTH BEACON ORANGE TIMEOUT ST (SAFETY) ×3 IMPLANT
DERMABOND ADVANCED (GAUZE/BANDAGES/DRESSINGS)
DERMABOND ADVANCED .7 DNX12 (GAUZE/BANDAGES/DRESSINGS) IMPLANT
DRSG OPSITE POSTOP 4X10 (GAUZE/BANDAGES/DRESSINGS) ×3 IMPLANT
ELECT REM PT RETURN 9FT ADLT (ELECTROSURGICAL) ×3
ELECTRODE REM PT RTRN 9FT ADLT (ELECTROSURGICAL) ×1 IMPLANT
EXTRACTOR VACUUM KIWI (MISCELLANEOUS) IMPLANT
GAUZE SPONGE 4X4 12PLY STRL LF (GAUZE/BANDAGES/DRESSINGS) ×6 IMPLANT
GLOVE BIO SURGEON STRL SZ 6 (GLOVE) ×3 IMPLANT
GLOVE BIOGEL PI IND STRL 6 (GLOVE) ×2 IMPLANT
GLOVE BIOGEL PI IND STRL 7.0 (GLOVE) ×1 IMPLANT
GLOVE BIOGEL PI INDICATOR 6 (GLOVE) ×4
GLOVE BIOGEL PI INDICATOR 7.0 (GLOVE) ×2
GOWN STRL REUS W/TWL LRG LVL3 (GOWN DISPOSABLE) ×6 IMPLANT
KIT ABG SYR 3ML LUER SLIP (SYRINGE) ×3 IMPLANT
NEEDLE HYPO 25X5/8 SAFETYGLIDE (NEEDLE) ×3 IMPLANT
NS IRRIG 1000ML POUR BTL (IV SOLUTION) ×3 IMPLANT
PACK C SECTION WH (CUSTOM PROCEDURE TRAY) ×3 IMPLANT
PAD ABD DERMACEA PRESS 5X9 (GAUZE/BANDAGES/DRESSINGS) ×3 IMPLANT
PAD OB MATERNITY 4.3X12.25 (PERSONAL CARE ITEMS) ×3 IMPLANT
PENCIL SMOKE EVAC W/HOLSTER (ELECTROSURGICAL) ×3 IMPLANT
SPONGE GAUZE 4X4 16PLY NONSTR (GAUZE/BANDAGES/DRESSINGS) ×6 IMPLANT
STRIP CLOSURE SKIN 1/2X4 (GAUZE/BANDAGES/DRESSINGS) ×2 IMPLANT
SUT CHROMIC 0 CTX 36 (SUTURE) ×9 IMPLANT
SUT MON AB 2-0 CT1 27 (SUTURE) ×3 IMPLANT
SUT PDS AB 0 CT1 27 (SUTURE) IMPLANT
SUT PLAIN 0 NONE (SUTURE) IMPLANT
SUT PLAIN 2 0 XLH (SUTURE) ×3 IMPLANT
SUT VIC AB 0 CT1 36 (SUTURE) IMPLANT
SUT VIC AB 4-0 KS 27 (SUTURE) IMPLANT
TOWEL OR 17X24 6PK STRL BLUE (TOWEL DISPOSABLE) ×3 IMPLANT
TRAY FOLEY W/BAG SLVR 14FR LF (SET/KITS/TRAYS/PACK) IMPLANT
WATER STERILE IRR 1000ML POUR (IV SOLUTION) ×3 IMPLANT

## 2019-10-24 NOTE — Progress Notes (Signed)
No change to H&P.  Eliga Arvie, DO 

## 2019-10-24 NOTE — Transfer of Care (Signed)
Immediate Anesthesia Transfer of Care Note  Patient: Pamela Martin  Procedure(s) Performed: REPEAT CESAREAN SECTION (N/A )  Patient Location: PACU  Anesthesia Type:Spinal  Level of Consciousness: awake  Airway & Oxygen Therapy: Patient Spontanous Breathing  Post-op Assessment: Report given to RN  Post vital signs: Reviewed and stable  Last Vitals:  Vitals Value Taken Time  BP 105/65 10/24/19 0836  Temp 36.8 C 10/24/19 0835  Pulse 78 10/24/19 0840  Resp 18 10/24/19 0840  SpO2 97 % 10/24/19 0840  Vitals shown include unvalidated device data.  Last Pain:  Vitals:   10/24/19 0835  TempSrc: Oral         Complications: No apparent anesthesia complications

## 2019-10-24 NOTE — Anesthesia Preprocedure Evaluation (Signed)
Anesthesia Evaluation  Patient identified by MRN, date of birth, ID band Patient awake    Reviewed: Allergy & Precautions, H&P , NPO status , Patient's Chart, lab work & pertinent test results  History of Anesthesia Complications Negative for: history of anesthetic complications  Airway Mallampati: II  TM Distance: >3 FB Neck ROM: full    Dental no notable dental hx.    Pulmonary neg pulmonary ROS,    Pulmonary exam normal        Cardiovascular negative cardio ROS Normal cardiovascular exam     Neuro/Psych negative neurological ROS  negative psych ROS   GI/Hepatic negative GI ROS, Neg liver ROS,   Endo/Other  diabetes, Type 1, Insulin Dependent  Renal/GU negative Renal ROS  negative genitourinary   Musculoskeletal negative musculoskeletal ROS (+)   Abdominal   Peds  Hematology  (+) Blood dyscrasia (Hgb 10.5), anemia ,   Anesthesia Other Findings  1 prior C/S  Reproductive/Obstetrics (+) Pregnancy                           Anesthesia Physical Anesthesia Plan  ASA: II  Anesthesia Plan: Spinal   Post-op Pain Management:    Induction:   PONV Risk Score and Plan: Ondansetron and Treatment may vary due to age or medical condition  Airway Management Planned:   Additional Equipment:   Intra-op Plan:   Post-operative Plan:   Informed Consent: I have reviewed the patients History and Physical, chart, labs and discussed the procedure including the risks, benefits and alternatives for the proposed anesthesia with the patient or authorized representative who has indicated his/her understanding and acceptance.       Plan Discussed with:   Anesthesia Plan Comments:         Anesthesia Quick Evaluation

## 2019-10-24 NOTE — Progress Notes (Signed)
Inpatient Diabetes Program Recommendations  AACE/ADA: New Consensus Statement on Inpatient Glycemic Control (2015)  Target Ranges:  Prepandial:   less than 140 mg/dL      Peak postprandial:   less than 180 mg/dL (1-2 hours)      Critically ill patients:  140 - 180 mg/dL   Lab Results  Component Value Date   GLUCAP 109 (H) 10/24/2019   HGBA1C 7.2 (H) 03/06/2009    Review of Glycemic Control Results for Pamela Martin, Pamela Martin (MRN [id number]) as of 10/24/2019 14:11  Ref. Range 10/24/2019 05:39 10/24/2019 12:49  Glucose-Capillary Latest Ref Range: 70 - 99 mg/dL 280 (H) 034 (H)   Diabetes history: Type 1 DM Outpatient Diabetes medications: Tresiba 28 units QHS,Novolog 4-6 units TID Prepregnancy: Tresiba 20 units, Novolog 4-6 units TID Current orders for Inpatient glycemic control: Lantus 14 units QHS, Novolog 4-6 units TID  Inpatient Diabetes Program Recommendations:    Noted consult.  Consider adding Novolog 0-6 units TID and increase carb coverage within meal coverage to Novolog 0-10 units based on 1 units per 5 grams of CHO (discussed with Dr Talmage Nap prior to admission).   Spoke with patient regarding outpatient diabetes management. Patient is followed by Dr Talmage Nap, outpatient endocrinology.  Discussed pre-pregancy dosages, orders per Dr Talmage Nap, CHO ratios, last basal admin, risk for hypoglycemia in postpartum period, need for snack at bedside, lactation with Type 1 DM and need to follow up with Dr Talmage Nap. Patient is in agreement with plan and has a postpartum appointment scheduled.  Follow.  Orders received by Dr Henderson Cloud.   Thanks, Lujean Rave, MSN, CDE, RNC-OB Diabetes Coordinator [phone] (8a-5p)

## 2019-10-24 NOTE — Anesthesia Postprocedure Evaluation (Deleted)
Anesthesia Post Note  Patient: Pamela Martin  Procedure(s) Performed: REPEAT CESAREAN SECTION (N/A )     Patient location during evaluation: Mother Baby Anesthesia Type: Spinal Level of consciousness: awake Pain management: satisfactory to patient Vital Signs Assessment: post-procedure vital signs reviewed and stable Respiratory status: spontaneous breathing Cardiovascular status: stable Anesthetic complications: no    Last Vitals:  Vitals:   10/24/19 0549 10/24/19 0835  BP: 126/88   Pulse: 91   Resp: 20   Temp: 36.8 C 36.8 C  SpO2: 100%     Last Pain:  Vitals:   10/24/19 0835  TempSrc: Oral   Pain Goal:                   Cephus Shelling

## 2019-10-24 NOTE — Anesthesia Postprocedure Evaluation (Signed)
Anesthesia Post Note  Patient: Pamela Martin  Procedure(s) Performed: REPEAT CESAREAN SECTION (N/A )     Patient location during evaluation: PACU Anesthesia Type: Spinal Level of consciousness: oriented and awake and alert Pain management: pain level controlled Vital Signs Assessment: post-procedure vital signs reviewed and stable Respiratory status: spontaneous breathing, respiratory function stable and nonlabored ventilation Cardiovascular status: blood pressure returned to baseline and stable Postop Assessment: no headache, no backache, no apparent nausea or vomiting and spinal receding Anesthetic complications: no    Last Vitals:  Vitals:   10/24/19 0942 10/24/19 1055  BP: (!) 101/53 (!) 115/58  Pulse: 76 67  Resp: 20 18  Temp:  36.9 C  SpO2: 95% 96%    Last Pain:  Vitals:   10/24/19 1055  TempSrc: Oral  PainSc: 2    Pain Goal:                   Lucretia Kern

## 2019-10-24 NOTE — Progress Notes (Signed)
PT declined all insulin management including CBGs. Pt. prefers to manage her diabetes on her own. Pt has all insulin and supplies with her from home. Dr. Henderson Cloud notified and order was received for Pt to manage her own diabetes. [facility] on duty and dept asst. manager also consulted.

## 2019-10-24 NOTE — Anesthesia Procedure Notes (Signed)
Spinal  Patient location during procedure: OR Staffing Performed: anesthesiologist  Anesthesiologist: Benjamen Koelling E, MD Preanesthetic Checklist Completed: patient identified, IV checked, risks and benefits discussed, surgical consent, monitors and equipment checked, pre-op evaluation and timeout performed Spinal Block Patient position: sitting Prep: DuraPrep and site prepped and draped Patient monitoring: continuous pulse ox, blood pressure and heart rate Approach: midline Location: L3-4 Injection technique: single-shot Needle Needle type: Pencan  Needle gauge: 24 G Needle length: 9 cm Additional Notes Functioning IV was confirmed and monitors were applied. Sterile prep and drape, including hand hygiene and sterile gloves were used. The patient was positioned and the spine was prepped. The skin was anesthetized with lidocaine.  Free flow of clear CSF was obtained prior to injecting local anesthetic into the CSF. The needle was carefully withdrawn. The patient tolerated the procedure well.      

## 2019-10-24 NOTE — Op Note (Signed)
Pamela Martin PROCEDURE DATE: 10/24/2019  PREOPERATIVE DIAGNOSIS: Intrauterine pregnancy at  [redacted]w[redacted]d weeks gestation, previous cesarean delivery, Type I DM  POSTOPERATIVE DIAGNOSIS: The same  PROCEDURE: Repeat Low Transverse Cesarean Section  SURGEON:  Dr. Mitchel Honour  INDICATIONS: Pamela Martin is a 34 y.o. G2P1001 at [redacted]w[redacted]d scheduled for cesarean section secondary to previous C/S and desire for repeat.  The risks of cesarean section discussed with the patient included but were not limited to: bleeding which may require transfusion or reoperation; infection which may require antibiotics; injury to bowel, bladder, ureters or other surrounding organs; injury to the fetus; need for additional procedures including hysterectomy in the event of a life-threatening hemorrhage; placental abnormalities wth subsequent pregnancies, incisional problems, thromboembolic phenomenon and other postoperative/anesthesia complications. The patient concurred with the proposed plan, giving informed written consent for the procedure.    FINDINGS:  Viable female infant in cephalic presentation, APGARs 8,8: weight pending Clear amniotic fluid.  Intact placenta, three vessel cord.  Grossly normal uterus, ovaries and fallopian tubes. .   ANESTHESIA:  Spinal ESTIMATED BLOOD LOSS: 600 ml SPECIMENS: Placenta sent to L&D COMPLICATIONS: None immediate  PROCEDURE IN DETAIL:  The patient received intravenous antibiotics and had sequential compression devices applied to her lower extremities while in the preoperative area.  She was then taken to the operating room where spinal anesthesia was administered and was found to be adequate. She was then placed in a dorsal supine position with a leftward tilt, and prepped and draped in a sterile manner.  A foley catheter was placed into her bladder and attached to constant gravity.  After an adequate timeout was performed, a Pfannenstiel skin incision was made with scalpel and carried through to  the underlying layer of fascia. The fascia was incised in the midline and this incision was extended bilaterally using the Mayo scissors. Kocher clamps were applied to the superior aspect of the fascial incision and the underlying rectus muscles were dissected off bluntly. A similar process was carried out on the inferior aspect of the facial incision. The rectus muscles were separated in the midline bluntly and the peritoneum was entered bluntly. Bladder flap was created sharply and developed bluntly.  Bladder blade was placed.  A transverse hysterotomy was made with a scalpel and extended bilaterally bluntly. The bladder blade was then removed. The infant was successfully delivered using a Kiwi vacuum assist (no pop-offs), and cord was clamped and cut and infant was handed over to awaiting neonatology team. Uterine massage was then administered and the placenta delivered intact with three-vessel cord. The uterus was cleared of clot and debris.  The hysterotomy was closed with 0 chromic.  A second imbricating suture of 0-chromic was used to reinforce the incision and aid in hemostasis.  The peritoneum and rectus muscles were noted to be hemostatic and were reapproximated using 3-0 monocryl in a running fashion.  The fascia was closed with 0-PDS in a running fashion with good restoration of anatomy.  The subcutaneus tissue was copiously irrigated and was reapproximated using 3 interrupted plain gut stitches.  The skin was closed with 4-0 vicryl in a subcuticular fashion.  Pt tolerated the procedure will.  All counts were correct x2.  Pt went to the recovery room in stable condition.

## 2019-10-25 ENCOUNTER — Encounter: Payer: Self-pay | Admitting: *Deleted

## 2019-10-25 LAB — CBC
HCT: 30.4 % — ABNORMAL LOW (ref 36.0–46.0)
Hemoglobin: 9.6 g/dL — ABNORMAL LOW (ref 12.0–15.0)
MCH: 27.4 pg (ref 26.0–34.0)
MCHC: 31.6 g/dL (ref 30.0–36.0)
MCV: 86.6 fL (ref 80.0–100.0)
Platelets: 173 10*3/uL (ref 150–400)
RBC: 3.51 MIL/uL — ABNORMAL LOW (ref 3.87–5.11)
RDW: 12.9 % (ref 11.5–15.5)
WBC: 9.5 10*3/uL (ref 4.0–10.5)
nRBC: 0 % (ref 0.0–0.2)

## 2019-10-25 LAB — GLUCOSE, CAPILLARY: Glucose-Capillary: 152 mg/dL — ABNORMAL HIGH (ref 70–99)

## 2019-10-25 LAB — BIRTH TISSUE RECOVERY COLLECTION (PLACENTA DONATION)

## 2019-10-25 NOTE — Progress Notes (Signed)
Subjective: Postpartum Day 1: Cesarean Delivery - RCS  Patient reports tolerating PO and no problems voiding.    Objective: Vital signs in last 24 hours: Temp:  [98.2 F (36.8 C)-98.7 F (37.1 C)] 98.2 F (36.8 C) (05/28 0514) Pulse Rate:  [56-81] 81 (05/28 0514) Resp:  [17-20] 18 (05/28 0514) BP: (101-121)/(50-69) 113/61 (05/28 0514) SpO2:  [95 %-98 %] 98 % (05/28 0514)  Physical Exam:  General: alert Lochia: appropriate Uterine Fundus: firm Incision: healing well DVT Evaluation: No evidence of DVT seen on physical exam.  Recent Labs    10/25/19 0601  HGB 9.6*  HCT 30.4*    Assessment/Plan: Status post Cesarean section. Doing well postoperatively.  Pt managing own pump and reports good BS. Baby girl doing well.   Pamela Martin 10/25/2019, 9:35 AM

## 2019-10-25 NOTE — Progress Notes (Signed)
Inpatient Diabetes Program Recommendations  AACE/ADA: New Consensus Statement on Inpatient Glycemic Control (2015)  Target Ranges:  Prepandial:   less than 140 mg/dL      Peak postprandial:   less than 180 mg/dL (1-2 hours)      Critically ill patients:  140 - 180 mg/dL   Lab Results  Component Value Date   GLUCAP 109 (H) 10/24/2019   HGBA1C 7.2 (H) 03/06/2009    Review of Glycemic Control Results for PERNIE, GROSSO (MRN [id number]) as of 10/25/2019 08:37  Ref. Range 10/24/2019 08:33 10/24/2019 12:49  Glucose-Capillary Latest Ref Range: 70 - 99 mg/dL 110 (H) 315 (H)   Diabetes history: Type 1 DM Outpatient Diabetes medications: Tresiba 28 units QHS,Novolog 4-6 units TID Prepregnancy: Tresiba 20 units, Novolog 4-6 units TID Current orders for Inpatient glycemic control: Lantus 14 units QHS, Novolog 4-6 units TID  Inpatient Diabetes Program Recommendations:    Noted MD order and patient independently managing DM. Is not on insulin pump. Given no policy to support independent management of diabetes with own personal insulin pens from home, even with MD order, hospital still liable for complications related to diabetes.   Maralyn Sago, RN notified and encouraged to reach out to MD. After discussion, RN unaware of any issue yesterday with patient occurring related to insulin administration, besides refusal.   Thanks, Lujean Rave, MSN, RNC-OB Diabetes Coordinator [phone] (8a-5p)

## 2019-10-26 LAB — GLUCOSE, RANDOM: Glucose, Bld: 111 mg/dL — ABNORMAL HIGH (ref 70–99)

## 2019-10-26 MED ORDER — DOCUSATE SODIUM 100 MG PO CAPS
100.0000 mg | ORAL_CAPSULE | Freq: Two times a day (BID) | ORAL | 2 refills | Status: AC
Start: 2019-10-26 — End: ?

## 2019-10-26 MED ORDER — IBUPROFEN 800 MG PO TABS: 800.0000 mg | ORAL_TABLET | Freq: Four times a day (QID) | ORAL | 0 refills | Status: AC

## 2019-10-26 MED ORDER — OXYCODONE HCL 5 MG PO TABS: 5.0000 mg | ORAL_TABLET | ORAL | 0 refills | Status: AC | PRN

## 2019-10-26 NOTE — Progress Notes (Addendum)
Patient has an order in to monitor her own blood sugar. Patient reports she took her blood sugar at 2306 and that it was 106. States that she took her long lasting insulin . Refused lantus ordered. Refused to let us check glucose.

## 2019-10-26 NOTE — Discharge Summary (Signed)
Postpartum Discharge Summary  Date of Service updated5/29/21     Patient Name: Pamela Martin DOB: 1985-10-29 MRN: [id number]  Date of admission: 10/24/2019 Delivery date:10/24/2019  Delivering provider: Linda Hedges  Date of discharge: 10/26/2019  Admitting diagnosis: Previous cesarean section [Z98.891] S/P cesarean section [Z98.891] Intrauterine pregnancy: 102w1d    Secondary diagnosis:  Active Problems:   Previous cesarean section   S/P cesarean section  Additional problems: diabetes    Discharge diagnosis: Term Pregnancy Delivered and T1DM                                              Post partum procedures:none Augmentation: N/A Complications: None  Hospital course: Sceduled C/S   34y.o. yo G2P2002 at 340w1das admitted to the hospital 10/24/2019 for scheduled cesarean section with the following indication:Elective Repeat.Delivery details are as follows:  Membrane Rupture Time/Date: 7:41 AM ,10/24/2019   Delivery Method:C-Section, Vacuum Assisted  Details of operation can be found in separate operative note.  Patient had an uncomplicated postpartum course.  She is ambulating, tolerating a regular diet, passing flatus, and urinating well. Patient is discharged home in stable condition on  10/26/19        Newborn Data: Birth date:10/24/2019  Birth time:7:42 AM  Gender:Female  Living status:Living  Apgars:8 ,8  Weight:4315 g     Magnesium Sulfate received: No BMZ received: No Rhophylac:N/A MMR:No T-DaP:Given prenatally Flu: N/A Transfusion:No  Physical exam  Vitals:   10/25/19 0514 10/25/19 1600 10/25/19 2116 10/26/19 0530  BP: 113/61 134/76 128/71 128/75  Pulse: 81 71 63 63  Resp: 18 19  18   Temp: 98.2 F (36.8 C) 98.7 F (37.1 C) 98.5 F (36.9 C) 98.8 F (37.1 C)  TempSrc: Oral  Oral Oral  SpO2: 98% 98% 98% 100%  Weight:      Height:       General: alert Lochia: appropriate Uterine Fundus: firm Incision: Healing well with no significant drainage DVT  Evaluation: No evidence of DVT seen on physical exam. Labs: Lab Results  Component Value Date   WBC 9.5 10/25/2019   HGB 9.6 (L) 10/25/2019   HCT 30.4 (L) 10/25/2019   MCV 86.6 10/25/2019   PLT 173 10/25/2019   CMP Latest Ref Rng & Units 09/01/2014  Glucose 70 - 99 mg/dL 209(H)  BUN 6 - 23 mg/dL 10  Creatinine 0.50 - 1.10 mg/dL 0.74  Sodium 135 - 145 mmol/L 136  Potassium 3.5 - 5.1 mmol/L 3.9  Chloride 96 - 112 mmol/L 103  CO2 19 - 32 mmol/L 20  Calcium 8.4 - 10.5 mg/dL 9.6   Edinburgh Score: Edinburgh Postnatal Depression Scale Screening Tool 10/25/2019  I have been able to laugh and see the funny side of things. 0  I have looked forward with enjoyment to things. 0  I have blamed myself unnecessarily when things went wrong. 1  I have been anxious or worried for no good reason. 1  I have felt scared or panicky for no good reason. 0  Things have been getting on top of me. 1  I have been so unhappy that I have had difficulty sleeping. 0  I have felt sad or miserable. 1  I have been so unhappy that I have been crying. 0  The thought of harming myself has occurred to me. 0  Edinburgh Postnatal Depression Scale  Total 4      After visit meds:  Allergies as of 10/26/2019   No Known Allergies     Medication List    STOP taking these medications   aspirin 81 MG chewable tablet     TAKE these medications   docusate sodium 100 MG capsule Commonly known as: Colace Take 1 capsule (100 mg total) by mouth 2 (two) times daily.   ibuprofen 800 MG tablet Commonly known as: ADVIL Take 1 tablet (800 mg total) by mouth 4 (four) times daily.   insulin aspart 100 UNIT/ML injection Commonly known as: novoLOG Inject 8-12 Units into the skin 3 (three) times daily before meals.   oxyCODONE 5 MG immediate release tablet Commonly known as: Oxy IR/ROXICODONE Take 1 tablet (5 mg total) by mouth every 4 (four) hours as needed for severe pain (pain scale 4-7).   prenatal multivitamin Tabs  tablet Take 1 tablet daily at 12 noon by mouth.   Tyler Aas FlexTouch 100 UNIT/ML FlexTouch Pen Generic drug: insulin degludec Inject 28 Units into the skin daily at 10 pm.        Discharge home in stable condition Infant Feeding: Breast Infant Disposition:home with mother Discharge instruction: per After Visit Summary and Postpartum booklet. Activity: Advance as tolerated. Pelvic rest for 6 weeks.  Diet: routine diet Anticipated Birth Control: Unsure Postpartum Appointment:6 weeks Additional Postpartum F/U: na Future Appointments:No future appointments. Follow up Visit:      10/26/2019 Tyson Dense, MD

## 2020-12-31 DIAGNOSIS — E109 Type 1 diabetes mellitus without complications: Secondary | ICD-10-CM | POA: Diagnosis not present

## 2021-02-02 DIAGNOSIS — Z01419 Encounter for gynecological examination (general) (routine) without abnormal findings: Secondary | ICD-10-CM | POA: Diagnosis not present

## 2021-02-02 DIAGNOSIS — Z6831 Body mass index (BMI) 31.0-31.9, adult: Secondary | ICD-10-CM | POA: Diagnosis not present

## 2021-02-22 DIAGNOSIS — Z1231 Encounter for screening mammogram for malignant neoplasm of breast: Secondary | ICD-10-CM | POA: Diagnosis not present

## 2021-06-28 DIAGNOSIS — E109 Type 1 diabetes mellitus without complications: Secondary | ICD-10-CM | POA: Diagnosis not present

## 2021-07-05 DIAGNOSIS — E109 Type 1 diabetes mellitus without complications: Secondary | ICD-10-CM | POA: Diagnosis not present

## 2021-10-07 IMAGING — US US MFM OB LIMITED
1 series · 15 of 28 positions shown · non-contrast
Comparison: none

[Series 1: us mfm ob limited · 38 acquisitions, 15 frames shown]
[im 1/38]
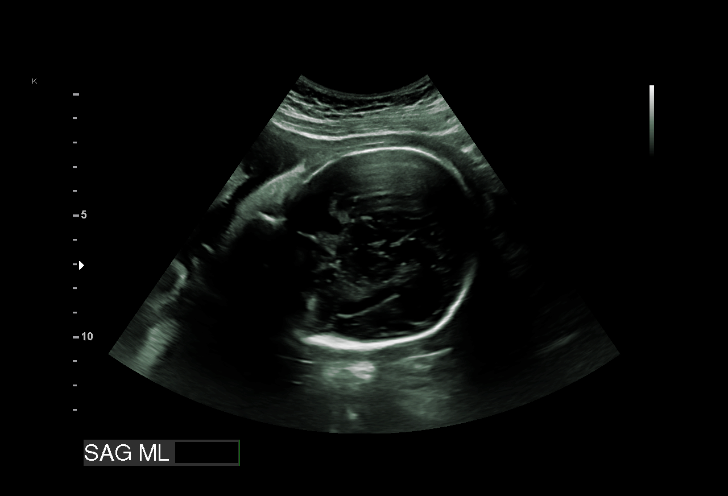
[im 3/38]
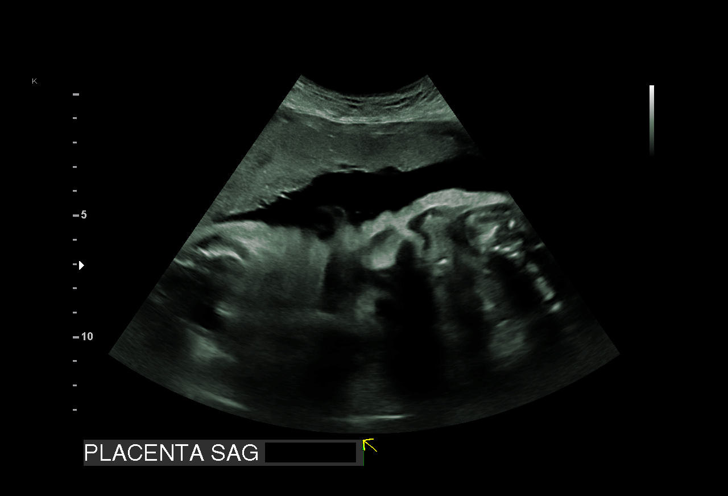
[im 6/38]
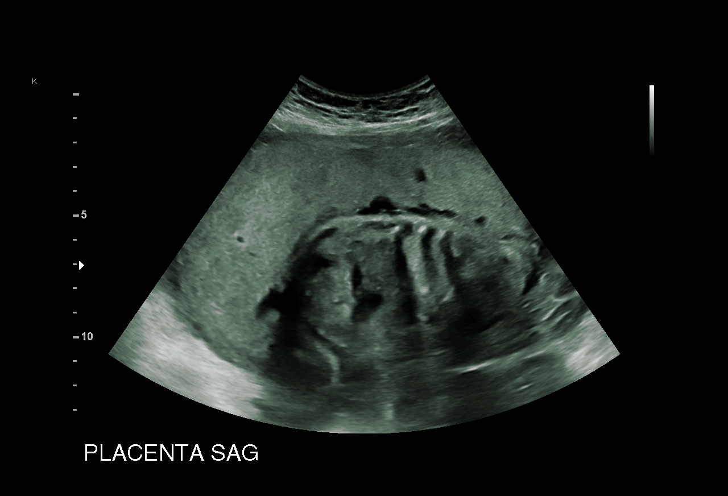
[im 9/38]
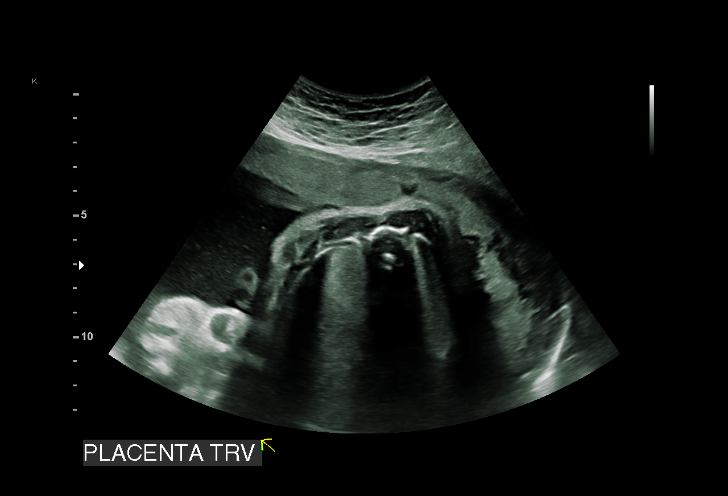
[im 11/38]
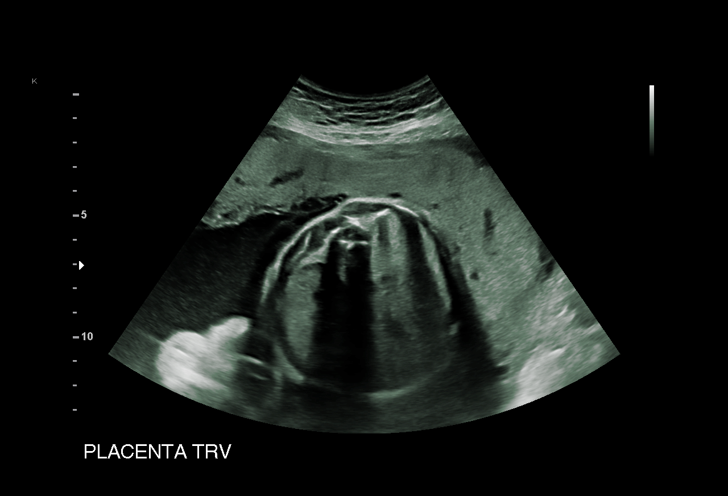
[im 14/38]
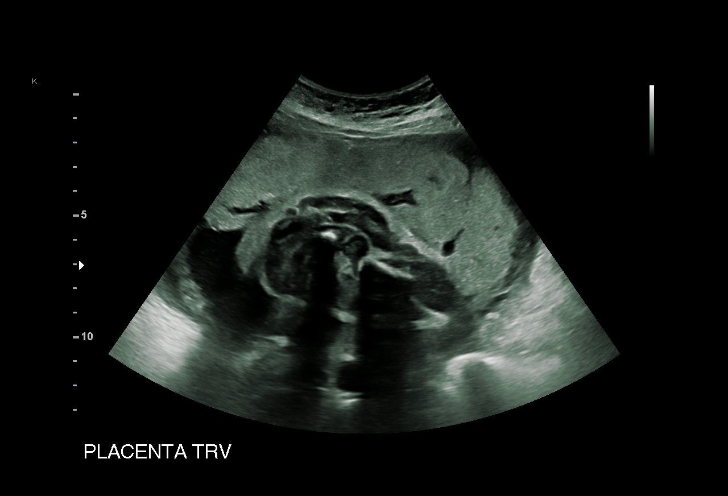
[im 17/38]
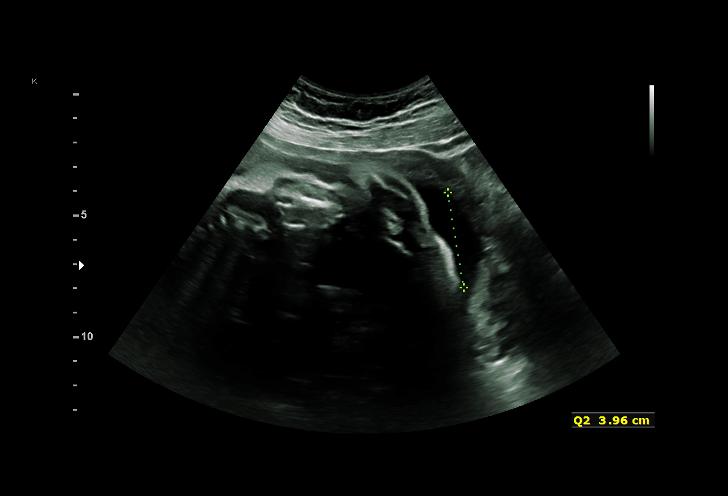
[im 20/38]
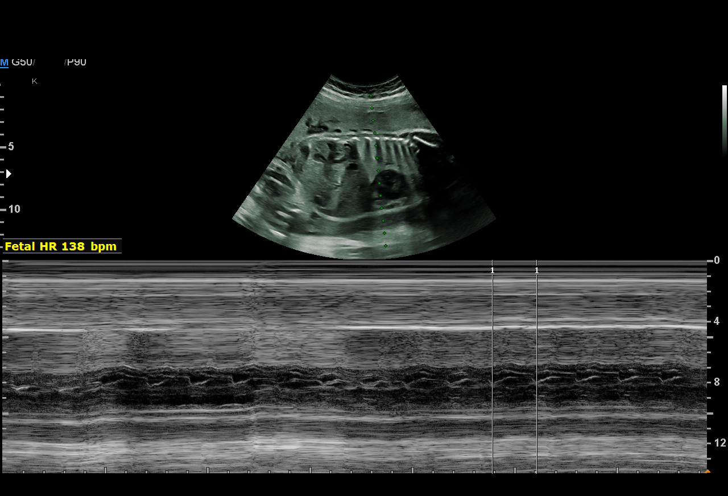
[im 21/38]
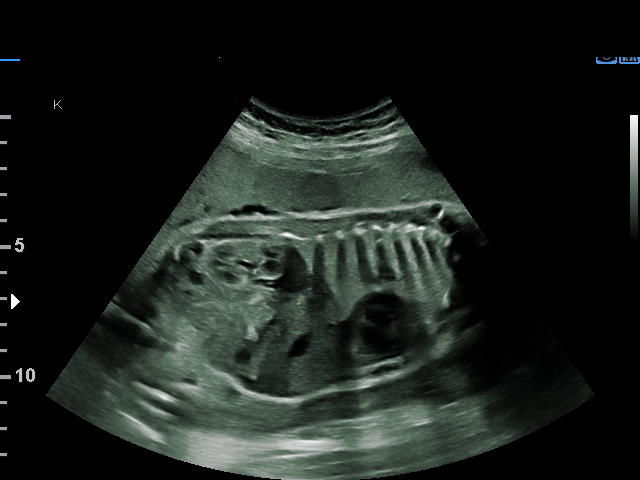
[im 24/38]
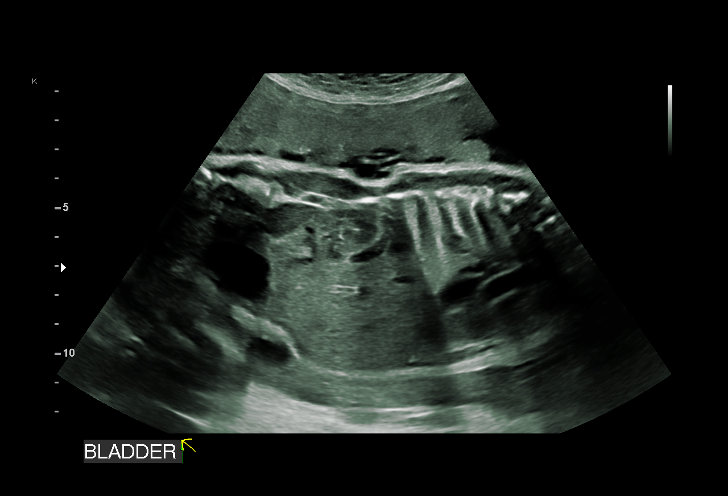
[im 27/38]
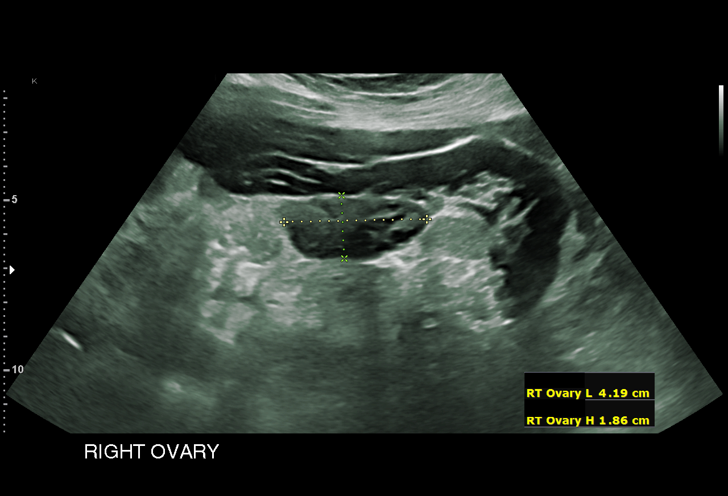
[im 29/38]
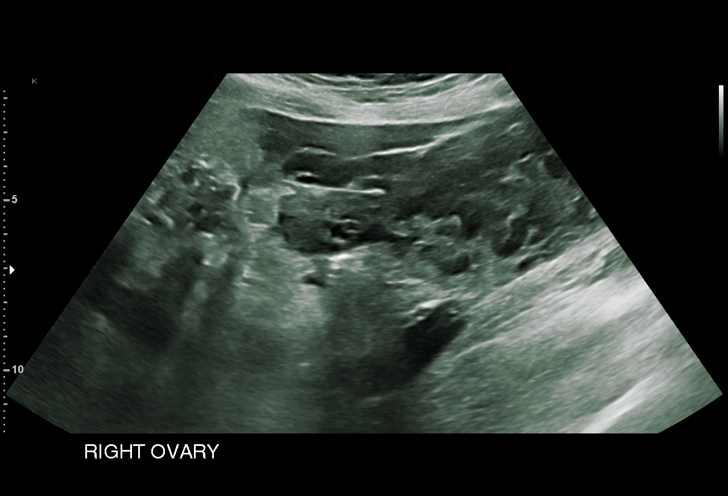
[im 32/38]
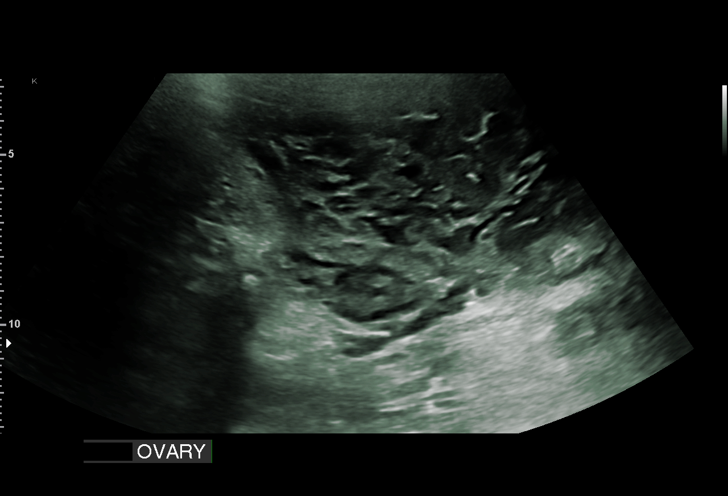
[im 35/38]
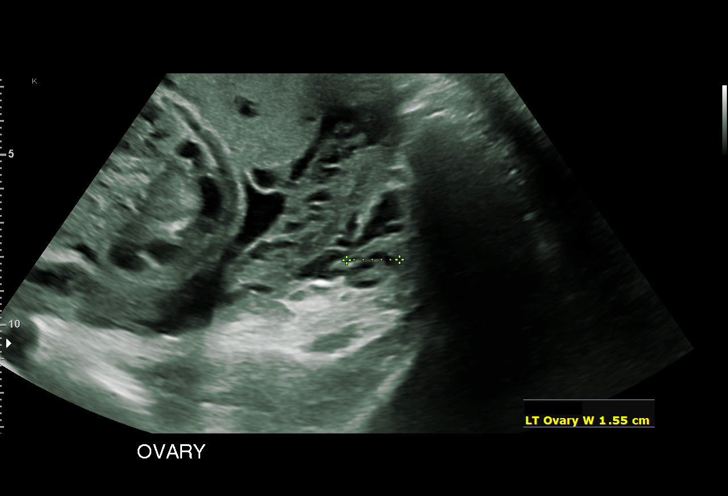
[im 38/38]
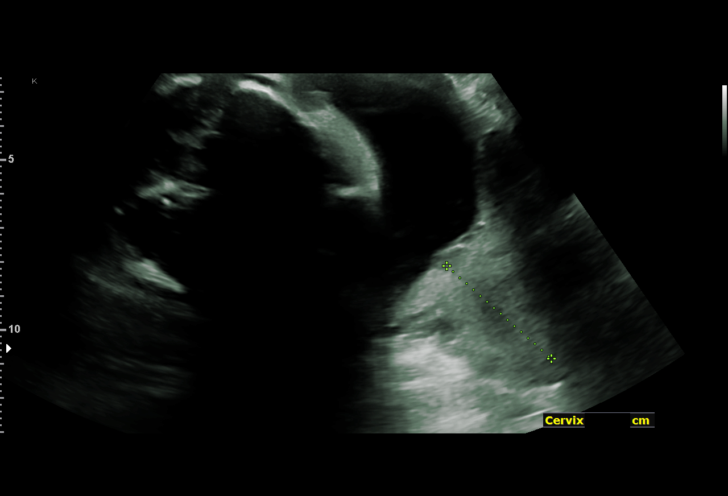

[15 of 28 positions shown; findings below may reference images not displayed]

1  US MFM OB LIMITED                    76815.01     ROBKO EXNER
 ----------------------------------------------------------------------

 ----------------------------------------------------------------------
Indications

  29 weeks gestation of pregnancy
  Vaginal bleeding in pregnancy, third trimester
  Diabetes - Pregestational,3rd trimester
 ----------------------------------------------------------------------
Fetal Evaluation

 Num Of Fetuses:         1
 Fetal Heart Rate(bpm):  138
 Cardiac Activity:       Observed
 Presentation:           Cephalic
 Placenta:               Anterior  LEFT

 Amniotic Fluid
 AFI FV:      Within normal limits

 AFI Sum(cm)     %Tile       Largest Pocket(cm)
 18.88           72

 RUQ(cm)       RLQ(cm)       LUQ(cm)        LLQ(cm)


 Comment:    No placental abruption or previa identified.
OB History

 Gravidity:    2         Term:   1
 Living:       1
Gestational Age

 Clinical EDD:  29w 5d                                        EDD:   10/30/19
 Best:          29w 5d     Det. By:  Clinical EDD             EDD:   10/30/19
Anatomy
 Cranium:               Appears normal         Kidneys:                Appear normal
 Ventricles:            Appears normal         Bladder:                Appears normal
 Stomach:               Appears normal, left
                        sided
Cervix Uterus Adnexa

 Cervix
 Length:           3.98  cm.
 Normal appearance by transabdominal scan.

 Left Ovary
 Within normal limits.

 Right Ovary
 Within normal limits.
Impression

 Patient was evaluated at the [facility] for c/o vaginal bleeding.
 On speculum examination, no vaginal bleeding was noted.

 A limited ultrasound study was performed. Amniotic fluid is
 normal and good fetal activity is seen. Placenta looks normal
 with no evidence of abruption. Ultrasound has limitations in
 diagnosing placental abruption.
                 [place], [place]

## 2022-01-12 DIAGNOSIS — E109 Type 1 diabetes mellitus without complications: Secondary | ICD-10-CM | POA: Diagnosis not present

## 2022-01-22 ENCOUNTER — Other Ambulatory Visit: Payer: Self-pay

## 2022-01-22 ENCOUNTER — Ambulatory Visit
Admission: RE | Admit: 2022-01-22 | Discharge: 2022-01-22 | Disposition: A | Discharge status: Home / Self Care | Payer: BC Managed Care – PPO | Source: Ambulatory Visit

## 2022-01-22 DIAGNOSIS — S8012XA Contusion of left lower leg, initial encounter: Secondary | ICD-10-CM

## 2022-01-22 NOTE — Discharge Instructions (Addendum)
Advised patient to RICE affected area of the left lower leg for 25 minutes 3 times daily for the next 3 days.  Advised may use OTC ibuprofen 800 mg daily, as needed for left lower leg pain advised if symptoms worsen and/or unresolved please follow-up with PCP or here for further evaluation.
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
## 2022-07-15 DIAGNOSIS — E109 Type 1 diabetes mellitus without complications: Secondary | ICD-10-CM | POA: Diagnosis not present

## 2022-07-29 DIAGNOSIS — E109 Type 1 diabetes mellitus without complications: Secondary | ICD-10-CM | POA: Diagnosis not present

## 2022-07-29 DIAGNOSIS — R000 Tachycardia, unspecified: Secondary | ICD-10-CM | POA: Diagnosis not present

## 2022-08-23 DIAGNOSIS — Z1151 Encounter for screening for human papillomavirus (HPV): Secondary | ICD-10-CM | POA: Diagnosis not present

## 2022-08-23 DIAGNOSIS — Z1239 Encounter for other screening for malignant neoplasm of breast: Secondary | ICD-10-CM | POA: Diagnosis not present

## 2022-08-23 DIAGNOSIS — Z6825 Body mass index (BMI) 25.0-25.9, adult: Secondary | ICD-10-CM | POA: Diagnosis not present

## 2022-08-23 DIAGNOSIS — Z01419 Encounter for gynecological examination (general) (routine) without abnormal findings: Secondary | ICD-10-CM | POA: Diagnosis not present

## 2022-08-23 DIAGNOSIS — Z124 Encounter for screening for malignant neoplasm of cervix: Secondary | ICD-10-CM | POA: Diagnosis not present

## 2023-06-09 DIAGNOSIS — E109 Type 1 diabetes mellitus without complications: Secondary | ICD-10-CM | POA: Diagnosis not present

## 2023-06-16 DIAGNOSIS — E109 Type 1 diabetes mellitus without complications: Secondary | ICD-10-CM | POA: Diagnosis not present

## 2023-06-16 DIAGNOSIS — R000 Tachycardia, unspecified: Secondary | ICD-10-CM | POA: Diagnosis not present

## 2023-10-04 DIAGNOSIS — Z01419 Encounter for gynecological examination (general) (routine) without abnormal findings: Secondary | ICD-10-CM | POA: Diagnosis not present

## 2023-10-04 DIAGNOSIS — Z1231 Encounter for screening mammogram for malignant neoplasm of breast: Secondary | ICD-10-CM | POA: Diagnosis not present

## 2023-10-04 DIAGNOSIS — Z6827 Body mass index (BMI) 27.0-27.9, adult: Secondary | ICD-10-CM | POA: Diagnosis not present

## 2023-10-13 DIAGNOSIS — E109 Type 1 diabetes mellitus without complications: Secondary | ICD-10-CM | POA: Diagnosis not present

## 2023-10-25 DIAGNOSIS — R000 Tachycardia, unspecified: Secondary | ICD-10-CM | POA: Diagnosis not present

## 2023-10-25 DIAGNOSIS — E109 Type 1 diabetes mellitus without complications: Secondary | ICD-10-CM | POA: Diagnosis not present

## 2023-10-25 DIAGNOSIS — Z4681 Encounter for fitting and adjustment of insulin pump: Secondary | ICD-10-CM | POA: Diagnosis not present

## 2023-12-14 DIAGNOSIS — E109 Type 1 diabetes mellitus without complications: Secondary | ICD-10-CM | POA: Diagnosis not present

## 2023-12-14 DIAGNOSIS — Z4681 Encounter for fitting and adjustment of insulin pump: Secondary | ICD-10-CM | POA: Diagnosis not present

## 2023-12-14 DIAGNOSIS — R000 Tachycardia, unspecified: Secondary | ICD-10-CM | POA: Diagnosis not present
